# Patient Record
Sex: Male | Born: 1955 | Race: Black or African American | Hispanic: No | Marital: Single | Smoking: Never smoker
Health system: Southern US, Community
[De-identification: ages and names within clinical notes are randomized; demographics above are authoritative.]

## PROBLEM LIST (undated history)

## (undated) DIAGNOSIS — I10 Essential (primary) hypertension: Secondary | ICD-10-CM

## (undated) DIAGNOSIS — E785 Hyperlipidemia, unspecified: Secondary | ICD-10-CM

## (undated) DIAGNOSIS — E119 Type 2 diabetes mellitus without complications: Secondary | ICD-10-CM

## (undated) HISTORY — DX: Hyperlipidemia, unspecified: E78.5

## (undated) HISTORY — DX: Type 2 diabetes mellitus without complications: E11.9

## (undated) HISTORY — PX: OTHER SURGICAL HISTORY: SHX169

---

## 2013-06-13 DIAGNOSIS — Z0000 Encounter for general adult medical examination without abnormal findings: Secondary | ICD-10-CM | POA: Diagnosis not present

## 2013-06-13 DIAGNOSIS — E669 Obesity, unspecified: Secondary | ICD-10-CM | POA: Diagnosis not present

## 2013-07-05 DIAGNOSIS — E785 Hyperlipidemia, unspecified: Secondary | ICD-10-CM | POA: Diagnosis not present

## 2013-07-05 DIAGNOSIS — I10 Essential (primary) hypertension: Secondary | ICD-10-CM | POA: Diagnosis not present

## 2013-07-05 DIAGNOSIS — E669 Obesity, unspecified: Secondary | ICD-10-CM | POA: Diagnosis not present

## 2013-07-06 DIAGNOSIS — H6120 Impacted cerumen, unspecified ear: Secondary | ICD-10-CM | POA: Diagnosis not present

## 2013-07-06 DIAGNOSIS — Z125 Encounter for screening for malignant neoplasm of prostate: Secondary | ICD-10-CM | POA: Diagnosis not present

## 2013-07-06 DIAGNOSIS — E785 Hyperlipidemia, unspecified: Secondary | ICD-10-CM | POA: Diagnosis not present

## 2013-07-06 DIAGNOSIS — I10 Essential (primary) hypertension: Secondary | ICD-10-CM | POA: Diagnosis not present

## 2013-07-09 DIAGNOSIS — I10 Essential (primary) hypertension: Secondary | ICD-10-CM | POA: Diagnosis not present

## 2013-07-31 DIAGNOSIS — I10 Essential (primary) hypertension: Secondary | ICD-10-CM | POA: Diagnosis not present

## 2013-07-31 DIAGNOSIS — K219 Gastro-esophageal reflux disease without esophagitis: Secondary | ICD-10-CM | POA: Diagnosis not present

## 2013-08-21 DIAGNOSIS — M109 Gout, unspecified: Secondary | ICD-10-CM | POA: Diagnosis not present

## 2013-08-21 DIAGNOSIS — E119 Type 2 diabetes mellitus without complications: Secondary | ICD-10-CM | POA: Diagnosis not present

## 2013-08-21 DIAGNOSIS — I10 Essential (primary) hypertension: Secondary | ICD-10-CM | POA: Diagnosis not present

## 2013-08-21 DIAGNOSIS — E785 Hyperlipidemia, unspecified: Secondary | ICD-10-CM | POA: Diagnosis not present

## 2013-08-21 DIAGNOSIS — H539 Unspecified visual disturbance: Secondary | ICD-10-CM | POA: Diagnosis not present

## 2013-08-21 DIAGNOSIS — R5383 Other fatigue: Secondary | ICD-10-CM | POA: Diagnosis not present

## 2013-08-21 DIAGNOSIS — K219 Gastro-esophageal reflux disease without esophagitis: Secondary | ICD-10-CM | POA: Diagnosis not present

## 2013-08-21 DIAGNOSIS — I119 Hypertensive heart disease without heart failure: Secondary | ICD-10-CM | POA: Diagnosis not present

## 2013-08-21 DIAGNOSIS — R5381 Other malaise: Secondary | ICD-10-CM | POA: Diagnosis not present

## 2013-08-21 DIAGNOSIS — A048 Other specified bacterial intestinal infections: Secondary | ICD-10-CM | POA: Diagnosis not present

## 2013-08-22 DIAGNOSIS — Z125 Encounter for screening for malignant neoplasm of prostate: Secondary | ICD-10-CM | POA: Diagnosis not present

## 2013-08-22 DIAGNOSIS — R946 Abnormal results of thyroid function studies: Secondary | ICD-10-CM | POA: Diagnosis not present

## 2013-08-23 DIAGNOSIS — K219 Gastro-esophageal reflux disease without esophagitis: Secondary | ICD-10-CM | POA: Diagnosis not present

## 2013-08-30 ENCOUNTER — Encounter (HOSPITAL_COMMUNITY): Payer: Self-pay | Admitting: Emergency Medicine

## 2013-08-30 ENCOUNTER — Emergency Department (HOSPITAL_COMMUNITY): Payer: Medicare Other

## 2013-08-30 ENCOUNTER — Emergency Department (HOSPITAL_COMMUNITY)
Admission: EM | Admit: 2013-08-30 | Discharge: 2013-08-30 | Disposition: A | Discharge status: Home / Self Care | Payer: Medicare Other | Attending: Emergency Medicine | Admitting: Emergency Medicine

## 2013-08-30 DIAGNOSIS — D649 Anemia, unspecified: Secondary | ICD-10-CM | POA: Insufficient documentation

## 2013-08-30 DIAGNOSIS — M109 Gout, unspecified: Secondary | ICD-10-CM | POA: Insufficient documentation

## 2013-08-30 DIAGNOSIS — M25569 Pain in unspecified knee: Secondary | ICD-10-CM | POA: Insufficient documentation

## 2013-08-30 DIAGNOSIS — M10061 Idiopathic gout, right knee: Secondary | ICD-10-CM

## 2013-08-30 DIAGNOSIS — I10 Essential (primary) hypertension: Secondary | ICD-10-CM | POA: Insufficient documentation

## 2013-08-30 DIAGNOSIS — D72829 Elevated white blood cell count, unspecified: Secondary | ICD-10-CM | POA: Diagnosis not present

## 2013-08-30 DIAGNOSIS — Z792 Long term (current) use of antibiotics: Secondary | ICD-10-CM | POA: Insufficient documentation

## 2013-08-30 DIAGNOSIS — M25561 Pain in right knee: Secondary | ICD-10-CM

## 2013-08-30 HISTORY — DX: Essential (primary) hypertension: I10

## 2013-08-30 LAB — CBC WITH DIFFERENTIAL/PLATELET
BASOS ABS: 0 10*3/uL (ref 0.0–0.1)
BASOS PCT: 0 % (ref 0–1)
Eosinophils Absolute: 0 10*3/uL (ref 0.0–0.7)
Eosinophils Relative: 0 % (ref 0–5)
HCT: 35.6 % — ABNORMAL LOW (ref 39.0–52.0)
Hemoglobin: 11.6 g/dL — ABNORMAL LOW (ref 13.0–17.0)
LYMPHS PCT: 12 % (ref 12–46)
Lymphs Abs: 1.4 10*3/uL (ref 0.7–4.0)
MCH: 27.8 pg (ref 26.0–34.0)
MCHC: 32.6 g/dL (ref 30.0–36.0)
MCV: 85.4 fL (ref 78.0–100.0)
Monocytes Absolute: 1.1 10*3/uL — ABNORMAL HIGH (ref 0.1–1.0)
Monocytes Relative: 9 % (ref 3–12)
NEUTROS ABS: 9.5 10*3/uL — AB (ref 1.7–7.7)
NEUTROS PCT: 79 % — AB (ref 43–77)
Platelets: 289 10*3/uL (ref 150–400)
RBC: 4.17 MIL/uL — ABNORMAL LOW (ref 4.22–5.81)
RDW: 14.1 % (ref 11.5–15.5)
WBC: 12 10*3/uL — ABNORMAL HIGH (ref 4.0–10.5)

## 2013-08-30 LAB — SYNOVIAL CELL COUNT + DIFF, W/ CRYSTALS
Crystals, Fluid: NONE SEEN
Lymphocytes-Synovial Fld: 2 % (ref 0–20)
MONOCYTE-MACROPHAGE-SYNOVIAL FLUID: 9 % — AB (ref 50–90)
Neutrophil, Synovial: 89 % — ABNORMAL HIGH (ref 0–25)
WBC, SYNOVIAL: UNDETERMINED /mm3 (ref 0–200)

## 2013-08-30 LAB — BASIC METABOLIC PANEL
ANION GAP: 17 — AB (ref 5–15)
BUN: 29 mg/dL — ABNORMAL HIGH (ref 6–23)
CHLORIDE: 99 meq/L (ref 96–112)
CO2: 22 mEq/L (ref 19–32)
Calcium: 9.8 mg/dL (ref 8.4–10.5)
Creatinine, Ser: 1.45 mg/dL — ABNORMAL HIGH (ref 0.50–1.35)
GFR, EST AFRICAN AMERICAN: 61 mL/min — AB (ref >90)
GFR, EST NON AFRICAN AMERICAN: 52 mL/min — AB (ref >90)
Glucose, Bld: 103 mg/dL — ABNORMAL HIGH (ref 70–99)
POTASSIUM: 4.8 meq/L (ref 3.7–5.3)
SODIUM: 138 meq/L (ref 137–147)

## 2013-08-30 LAB — GRAM STAIN: SPECIAL REQUESTS: NORMAL

## 2013-08-30 LAB — C-REACTIVE PROTEIN: CRP: 2.2 mg/dL — AB (ref <0.60)

## 2013-08-30 LAB — URIC ACID: URIC ACID, SERUM: 9.1 mg/dL — AB (ref 4.0–7.8)

## 2013-08-30 LAB — SEDIMENTATION RATE: Sed Rate: 65 mm/hr — ABNORMAL HIGH (ref 0–16)

## 2013-08-30 MED ORDER — PREDNISONE 50 MG PO TABS: ORAL_TABLET | ORAL | Status: DC

## 2013-08-30 MED ORDER — OXYCODONE-ACETAMINOPHEN 5-325 MG PO TABS
1.0000 | ORAL_TABLET | Freq: Once | ORAL | Status: DC
  Filled 2013-08-30: qty 1

## 2013-08-30 MED ORDER — HYDROCODONE-ACETAMINOPHEN 5-325 MG PO TABS: 1.0000 | ORAL_TABLET | ORAL | Status: DC | PRN

## 2013-08-30 NOTE — ED Provider Notes (Signed)
Medical screening examination/treatment/procedure(s) were performed by non-physician practitioner and as supervising physician I was immediately available for consultation/collaboration.   EKG Interpretation None        Merryl Hacker, MD 08/30/13 [phone]

## 2013-08-30 NOTE — ED Provider Notes (Signed)
CSN: [id number]     Arrival date & time 08/30/13  1121 History  This chart was scribed for non-physician practitioner, Clayton Bibles, PA-C, working with Merryl Hacker, MD by Ladene Artist, ED Scribe. This patient was seen in room TR08C/TR08C and the patient's care was started at 11:57 AM.   Chief Complaint  Patient presents with  . Knee Pain   The history is provided by the patient. No language interpreter was used.  HPI Comments: Robert Cole is a 58 y.o. male, with a  H/o HTN, who presents to the Emergency Department complaining of constant R knee pain onset 2 days ago. Pt states that he "turned the wrong way" in bed prior to onset of pain. He states that he can not extend his leg due to severity of pain. Pt reports intermittent weakness in lower extremities with walking. Pt states that he walks a lot. He denies fall or injury. He also denies fever, urinary symptoms, numbness. No hx gout. Pt is currently on AMX and Clarithromycin for a stomach infection.   Later noted by sister who arrived much later that all of the brothers in her family have gout.    Past Medical History  Diagnosis Date  . Hypertension    History reviewed. No pertinent past surgical history. History reviewed. No pertinent family history. History  Substance Use Topics  . Smoking status: Never Smoker   . Smokeless tobacco: Not on file  . Alcohol Use: No    Review of Systems  Constitutional: Negative for fever and chills.  Respiratory: Negative for shortness of breath.   Cardiovascular: Negative for chest pain.  Gastrointestinal: Negative for nausea, vomiting, abdominal pain and diarrhea.  Genitourinary: Negative.   Musculoskeletal: Positive for arthralgias.  Neurological: Positive for weakness (intermittent). Negative for numbness.  All other systems reviewed and are negative.  Allergies  Review of patient's allergies indicates no known allergies.  Home Medications   Prior to Admission medications    Medication Sig Start Date End Date Taking? Authorizing Provider  amoxicillin (AMOXIL) 500 MG capsule Take 500 mg by mouth 2 (two) times daily.   Yes Historical Provider, MD  clarithromycin (BIAXIN) 500 MG tablet Take 500 mg by mouth 2 (two) times daily.   Yes Historical Provider, MD   Triage Vitals: BP 176/102  Pulse 115  Temp(Src) 98.8 F (37.1 C) (Oral)  Resp 18  SpO2 96% Physical Exam  Nursing note and vitals reviewed. Constitutional: He appears well-developed and well-nourished. No distress.  HENT:  Head: Normocephalic and atraumatic.  Neck: Neck supple.  Pulmonary/Chest: Effort normal.  Musculoskeletal:       Right knee: He exhibits decreased range of motion, effusion and erythema. Tenderness found.  R knee with anterior erythema, mild edema, joint effusion present Pt unable to extend his leg or move; fixed in 90 degree flexion Distal sensation & pulses intact No warmth  Neurological: He is alert.  Skin: He is not diaphoretic.   ED Course  ARTHOCENTESIS Date/Time: 08/30/2013 2:02 PM Performed by: Clayton Bibles Authorized by: Clayton Bibles Consent: Verbal consent obtained. Consent given by: patient Patient understanding: patient states understanding of the procedure being performed Imaging studies: imaging studies available Patient identity confirmed: verbally with patient Indications: pain,  possible septic joint,  diagnostic evaluation and joint swelling  Body area: knee Joint: right knee Local anesthesia used: yes Anesthesia: local infiltration Local anesthetic: lidocaine 2% without epinephrine Anesthetic total: 6 ml Patient sedated: no Needle gauge: 18 G Ultrasound guidance: no Approach: medial  Aspirate: blood-tinged,  serous and clear Aspirate amount: 1 ml Patient tolerance: Patient tolerated the procedure well with no immediate complications. Comments: Small amount of clear light yellow fluid aspirated, very small amount of blood-tinged fluid at end of  aspirate.    (including critical care time) DIAGNOSTIC STUDIES: Oxygen Saturation is 96% on RA, normal by my interpretation.    COORDINATION OF CARE: 12:01 PM-Discussed treatment plan which includes XR and diagnostic lab work with pt at bedside and pt agreed to plan.   3:40 PM-Spoke with pt about gout. His brother and sister are along bedside. All of pt's brothers have had gout.   3:44 PM-Spoke with pharmacist about possible Oxy and Clarithromycin interaction; she stated that it was nothing to worry about.   Labs Review Labs Reviewed  CBC WITH DIFFERENTIAL - Abnormal; Notable for the following:    WBC 12.0 (*)    RBC 4.17 (*)    Hemoglobin 11.6 (*)    HCT 35.6 (*)    Neutrophils Relative % 79 (*)    Neutro Abs 9.5 (*)    Monocytes Absolute 1.1 (*)    All other components within normal limits  URIC ACID - Abnormal; Notable for the following:    Uric Acid, Serum 9.1 (*)    All other components within normal limits  BASIC METABOLIC PANEL - Abnormal; Notable for the following:    Glucose, Bld 103 (*)    BUN 29 (*)    Creatinine, Ser 1.45 (*)    GFR calc non Af Amer 52 (*)    GFR calc Af Amer 61 (*)    Anion gap 17 (*)    All other components within normal limits  SEDIMENTATION RATE - Abnormal; Notable for the following:    Sed Rate 65 (*)    All other components within normal limits  SYNOVIAL CELL COUNT + DIFF, W/ CRYSTALS - Abnormal; Notable for the following:    Color, Synovial YELLOW (*)    Appearance-Synovial CLOUDY (*)    Neutrophil, Synovial 89 (*)    Monocyte-Macrophage-Synovial Fluid 9 (*)    All other components within normal limits  GRAM STAIN  BODY FLUID CULTURE  C-REACTIVE PROTEIN   Imaging Review Dg Knee Complete 4 Views Right  08/30/2013   CLINICAL DATA:  Injury to the right knee.  Right knee pain.  EXAM: RIGHT KNEE - COMPLETE 4+ VIEW  COMPARISON:  No priors.  FINDINGS: Multiple views of the right knee demonstrate no acute displaced fracture, subluxation,  dislocation, or soft tissue abnormality.  IMPRESSION: No acute radiographic abnormality of the right knee.   Electronically Signed   By: Vinnie Langton M.D.   On: 08/30/2013 13:01    EKG Interpretation None      Not enough fluid for all tests ordered - prioritized culture and gram stain.  Lab unable to do cell counts and crystals.  Also note that they would be unable to do crystal count because the sample was clotted.   Reviewed lab results with Dr Dina Rich.  Pt to be treated for gout.    Filed Vitals:   08/30/13 1550  BP: 183/94  Pulse: 85  Temp:   Resp:      MDM   Final diagnoses:  Right knee pain  Acute idiopathic gout of right knee    Afebrile nontoxic pt with right knee pain, swelling, erythema without injury. CBC shows leukocytosis and mild anemia.  BMP shows renal insufficiency with no labs available for comparison - per  sister, Dr Vista Lawman (PCP) had mentioned "something about his kidneys" at their last visit and they have a follow up scheduled for mid August.  Uric acid is elevated.  Sed rate also moderately elevated.  I was only able to aspirate a small amount of synovial fluid but its appearance was reassuring.  Culture pending.  Gram stain showed no organisms.  Given labs and fluid results, will treat as gout with close orthopedic follow up.  Discussed return precautions.  Pt given prednisone and norco.  I did not give patient high dose NSAIDs given renal function.  Pt declined pain medication throughout visit.  D/C with crutches.  Discussed result, findings, treatment, and follow up  with patient as well as patient's brother and sister.  Pt given return precautions.  Pt verbalizes understanding and agrees with plan.      I doubt any other EMC precluding discharge at this time including, but not necessarily limited to the following: septic joint  I personally performed the services described in this documentation, which was scribed in my presence. The recorded information  has been reviewed and is accurate.    Har[place], PA-C 08/30/13 [phone]

## 2013-08-30 NOTE — Discharge Instructions (Signed)
Read the information below.  Use the prescribed medication as directed.  Please discuss all new medications with your pharmacist.  Do not take additional tylenol while taking the prescribed pain medication to avoid overdose.  You may return to the Emergency Department at any time for worsening condition or any new symptoms that concern you.  If you develop uncontrolled pain, weakness or numbness of the extremity, severe discoloration of the skin, or you are unable to walk, return to the ER for a recheck.       Gout Gout is when your joints become red, sore, and swell (inflamed). This is caused by the buildup of uric acid crystals in the joints. Uric acid is a chemical that is normally in the blood. If the level of uric acid gets too high in the blood, these crystals form in your joints and tissues. Over time, these crystals can form into masses near the joints and tissues. These masses can destroy bone and cause the bone to look misshapen (deformed). HOME CARE   Do not take aspirin for pain.  Only take medicine as told by your doctor.  Rest the joint as much as you can. When in bed, keep sheets and blankets off painful areas.  Keep the sore joints raised (elevated).  Put warm or cold packs on painful joints. Use of warm or cold packs depends on which works best for you.  Use crutches if the painful joint is in your leg.  Drink enough fluids to keep your pee (urine) clear or pale yellow. Limit alcohol, sugary drinks, and drinks with fructose in them.  Follow your diet instructions. Pay careful attention to how much protein you eat. Include fruits, vegetables, whole grains, and fat-free or low-fat milk products in your daily diet. Talk to your doctor or dietitian about the use of coffee, vitamin C, and cherries. These may help lower uric acid levels.  Keep a healthy body weight. GET HELP RIGHT AWAY IF:   You have watery poop (diarrhea), throw up (vomit), or have any side effects from  medicines.  You do not feel better in 24 hours, or you are getting worse.  Your joint becomes suddenly more tender, and you have chills or a fever. MAKE SURE YOU:   Understand these instructions.  Will watch your condition.  Will get help right away if you are not doing well or get worse. Document Released: 10/28/2007 Document Revised: 06/04/2013 Document Reviewed: 09/01/2011 [facility] Patient Information 2015 [place], [place]. This information is not intended to replace advice given to you by your health care provider. Make sure you discuss any questions you have with your health care provider.  Knee Pain Knee pain can be a result of an injury or other medical conditions. Treatment will depend on the cause of your pain. HOME CARE  Only take medicine as told by your doctor.  Keep a healthy weight. Being overweight can make the knee hurt more.  Stretch before exercising or playing sports.  If there is constant knee pain, change the way you exercise. Ask your doctor for advice.  Make sure shoes fit well. Choose the right shoe for the sport or activity.  Protect your knees. Wear kneepads if needed.  Rest when you are tired. GET HELP RIGHT AWAY IF:   Your knee pain does not stop.  Your knee pain does not get better.  Your knee joint feels hot to the touch.  You have a fever. MAKE SURE YOU:   Understand these instructions.  Will watch this condition.  Will get help right away if you are not doing well or get worse. Document Released: 04/16/2008 Document Revised: 04/12/2011 Document Reviewed: 04/16/2008 [facility] Patient Information 2015 [place], [place]. This information is not intended to replace advice given to you by your health care provider. Make sure you discuss any questions you have with your health care provider.  Low-Purine Diet Purines are compounds that affect the level of uric acid in your body. A low-purine diet is a diet that is low in purines. Eating a  low-purine diet can prevent the level of uric acid in your body from getting too high and causing gout or kidney stones or both. WHAT DO I NEED TO KNOW ABOUT THIS DIET?  Choose low-purine foods. Examples of low-purine foods are listed in the next section.  Drink plenty of fluids, especially water. Fluids can help remove uric acid from your body. Try to drink 8-16 cups (1.9-3.8 L) a day.  Limit foods high in fat, especially saturated fat, as fat makes it harder for the body to get rid of uric acid. Foods high in saturated fat include pizza, cheese, ice cream, whole milk, fried foods, and gravies. Choose foods that are lower in fat and lean sources of protein. Use olive oil when cooking as it contains healthy fats that are not high in saturated fat.  Limit alcohol. Alcohol interferes with the elimination of uric acid from your body. If you are having a gout attack, avoid all alcohol.  Keep in mind that different people's bodies react differently to different foods. You will probably learn over time which foods do or do not affect you. If you discover that a food tends to cause your gout to flare up, avoid eating that food. You can more freely enjoy foods that do not cause problems. If you have any questions about a food item, talk to your dietitian or health care provider. WHICH FOODS ARE LOW, MODERATE, AND HIGH IN PURINES? The following is a list of foods that are low, moderate, and high in purines. You can eat any amount of the foods that are low in purines. You may be able to have small amounts of foods that are moderate in purines. Ask your health care provider how much of a food moderate in purines you can have. Avoid foods high in purines. Grains  Foods low in purines: Enriched white bread, pasta, rice, cake, cornbread, popcorn.  Foods moderate in purines: Whole-grain breads and cereals, wheat germ, bran, oatmeal. Uncooked oatmeal. Dry wheat bran or wheat germ.  Foods high in purines:  Pancakes, [place] toast, biscuits, muffins. Vegetables  Foods low in purines: All vegetables, except those that are moderate in purines.  Foods moderate in purines: Asparagus, cauliflower, spinach, mushrooms, green peas. Fruits  All fruits are low in purines. Meats and other Protein Foods  Foods low in purines: Eggs, nuts, peanut butter.  Foods moderate in purines: 80-90% lean beef, lamb, veal, pork, poultry, fish, eggs, peanut butter, nuts. Crab, lobster, oysters, and shrimp. Cooked dried beans, peas, and lentils.  Foods high in purines: Anchovies, sardines, herring, mussels, tuna, codfish, scallops, trout, and haddock. Berniece Salines. Organ meats (such as liver or kidney). Tripe. Game meat. Goose. Sweetbreads. Dairy  All dairy foods are low in purines. Low-fat and fat-free dairy products are best because they are low in saturated fat. Beverages  Drinks low in purines: Water, carbonated beverages, tea, coffee, cocoa.  Drinks moderate in purines: Soft drinks and other drinks sweetened with  high-fructose corn syrup. Juices. To find whether a food or drink is sweetened with high-fructose corn syrup, look at the ingredients list.  Drinks high in purines: Alcoholic beverages (such as beer). Condiments  Foods low in purines: Salt, herbs, olives, pickles, relishes, vinegar.  Foods moderate in purines: Butter, margarine, oils, mayonnaise. Fats and Oils  Foods low in purines: All types, except gravies and sauces made with meat.  Foods high in purines: Gravies and sauces made with meat. Other Foods  Foods low in purines: Sugars, sweets, gelatin. Cake. Soups made without meat.  Foods moderate in purines: Meat-based or fish-based soups, broths, or bouillons. Foods and drinks sweetened with high-fructose corn syrup.  Foods high in purines: High-fat desserts (such as ice cream, cookies, cakes, pies, doughnuts, and chocolate). Contact your dietitian for more information on foods that are not  listed here. Document Released: 05/15/2010 Document Revised: 01/23/2013 Document Reviewed: 12/25/2012 [facility] Patient Information 2015 [place], [place]. This information is not intended to replace advice given to you by your health care provider. Make sure you discuss any questions you have with your health care provider.

## 2013-08-30 NOTE — Progress Notes (Signed)
[facility] Community Liaison at [place]:  Will be sending patient, upon discharge, information on [facility] [employer] and other resources to help patient establish primary care.

## 2013-08-30 NOTE — ED Notes (Signed)
Pt c/o right knee pain after turning "wrong" in bed x 2 days ago

## 2013-09-02 LAB — BODY FLUID CULTURE
CULTURE: NO GROWTH
SPECIAL REQUESTS: NORMAL

## 2013-09-27 DIAGNOSIS — B353 Tinea pedis: Secondary | ICD-10-CM | POA: Diagnosis not present

## 2013-09-27 DIAGNOSIS — M2040 Other hammer toe(s) (acquired), unspecified foot: Secondary | ICD-10-CM | POA: Diagnosis not present

## 2013-09-27 DIAGNOSIS — E119 Type 2 diabetes mellitus without complications: Secondary | ICD-10-CM | POA: Diagnosis not present

## 2013-09-27 DIAGNOSIS — L608 Other nail disorders: Secondary | ICD-10-CM | POA: Diagnosis not present

## 2013-10-01 DIAGNOSIS — E119 Type 2 diabetes mellitus without complications: Secondary | ICD-10-CM | POA: Diagnosis not present

## 2013-10-01 DIAGNOSIS — I119 Hypertensive heart disease without heart failure: Secondary | ICD-10-CM | POA: Diagnosis not present

## 2013-10-01 DIAGNOSIS — I10 Essential (primary) hypertension: Secondary | ICD-10-CM | POA: Diagnosis not present

## 2013-10-01 DIAGNOSIS — N19 Unspecified kidney failure: Secondary | ICD-10-CM | POA: Diagnosis not present

## 2013-10-01 DIAGNOSIS — R946 Abnormal results of thyroid function studies: Secondary | ICD-10-CM | POA: Diagnosis not present

## 2013-10-01 DIAGNOSIS — A048 Other specified bacterial intestinal infections: Secondary | ICD-10-CM | POA: Diagnosis not present

## 2013-10-01 DIAGNOSIS — K219 Gastro-esophageal reflux disease without esophagitis: Secondary | ICD-10-CM | POA: Diagnosis not present

## 2013-10-01 DIAGNOSIS — E785 Hyperlipidemia, unspecified: Secondary | ICD-10-CM | POA: Diagnosis not present

## 2013-10-16 ENCOUNTER — Encounter: Payer: Medicare Other | Attending: Internal Medicine

## 2013-10-16 VITALS — Ht 65.0 in | Wt 154.0 lb

## 2013-10-16 DIAGNOSIS — E119 Type 2 diabetes mellitus without complications: Secondary | ICD-10-CM | POA: Diagnosis not present

## 2013-10-16 DIAGNOSIS — Z713 Dietary counseling and surveillance: Secondary | ICD-10-CM | POA: Insufficient documentation

## 2013-10-18 NOTE — Progress Notes (Signed)
Patient was seen on 10/16/13 for the first of a series of three diabetes self-management courses at [facility].  Patient Education Plan per assessed needs and concerns is to attend four course education program for Diabetes Self Management Education.  Current HbA1c: Not available   FBS 107  The following learning objectives were met by the patient during this class:  Describe diabetes  State some common risk factors for diabetes  Defines the role of glucose and insulin  Identifies type of diabetes and pathophysiology  Describe the relationship between diabetes and cardiovascular risk  State the members of the Healthcare Team  States the rationale for glucose monitoring  State when to test glucose  State their individual Target Range  State the importance of logging glucose readings  Describe how to interpret glucose readings  Identifies A1C target  Explain the correlation between A1c and eAG values  State symptoms and treatment of high blood glucose  State symptoms and treatment of low blood glucose  Explain proper technique for glucose testing  Identifies proper sharps disposal  Handouts given during class include:  Living Well with Diabetes book  Carb Counting and Meal Planning book  Meal Plan Card  Carbohydrate guide  Meal planning worksheet  Low Sodium Flavoring Tips  The diabetes portion plate  I3J to eAG Conversion Chart  Diabetes Medications  Diabetes Recommended Care Schedule  Support Group  Diabetes Success Plan  Core Class Satisfaction Survey  Follow-Up Plan:  Attend core 2

## 2013-10-23 DIAGNOSIS — E119 Type 2 diabetes mellitus without complications: Secondary | ICD-10-CM

## 2013-10-23 NOTE — Progress Notes (Signed)

## 2013-10-30 DIAGNOSIS — I10 Essential (primary) hypertension: Secondary | ICD-10-CM | POA: Diagnosis not present

## 2013-10-30 DIAGNOSIS — N19 Unspecified kidney failure: Secondary | ICD-10-CM | POA: Diagnosis not present

## 2013-10-30 DIAGNOSIS — I119 Hypertensive heart disease without heart failure: Secondary | ICD-10-CM | POA: Diagnosis not present

## 2013-10-30 DIAGNOSIS — Z23 Encounter for immunization: Secondary | ICD-10-CM | POA: Diagnosis not present

## 2013-10-30 DIAGNOSIS — K219 Gastro-esophageal reflux disease without esophagitis: Secondary | ICD-10-CM | POA: Diagnosis not present

## 2013-10-30 DIAGNOSIS — E119 Type 2 diabetes mellitus without complications: Secondary | ICD-10-CM | POA: Diagnosis not present

## 2013-10-30 DIAGNOSIS — R946 Abnormal results of thyroid function studies: Secondary | ICD-10-CM | POA: Diagnosis not present

## 2013-10-30 DIAGNOSIS — E785 Hyperlipidemia, unspecified: Secondary | ICD-10-CM | POA: Diagnosis not present

## 2013-10-30 NOTE — Progress Notes (Signed)
Patient was seen on 10/30/2013 for the third of a series of three diabetes self-management courses at the [facility]. The following learning objectives were met by the patient during this class:    State the amount of activity recommended for healthy living   Describe activities suitable for individual needs   Identify ways to regularly incorporate activity into daily life   Identify barriers to activity and ways to over come these barriers  Identify diabetes medications being personally used and their primary action for lowering glucose and possible side effects   Describe role of stress on blood glucose and develop strategies to address psychosocial issues   Identify diabetes complications and ways to prevent them  Explain how to manage diabetes during illness   Evaluate success in meeting personal goal   Establish 2-3 goals that they will plan to diligently work on until they return for the  36-monthfollow-up visit  Goals:   I will count my carb choices at most meals and snacks  I will be active   3 times a week  Your patient has identified these potential barriers to change:  None stated  Your patient has identified their diabetes self-care support plan as  Family Support  Plan:  Attend Core 4 in 4 months

## 2013-12-31 DIAGNOSIS — L602 Onychogryphosis: Secondary | ICD-10-CM | POA: Diagnosis not present

## 2013-12-31 DIAGNOSIS — E119 Type 2 diabetes mellitus without complications: Secondary | ICD-10-CM | POA: Diagnosis not present

## 2014-02-04 ENCOUNTER — Ambulatory Visit: Payer: Medicare Other

## 2014-02-06 DIAGNOSIS — N19 Unspecified kidney failure: Secondary | ICD-10-CM | POA: Diagnosis not present

## 2014-02-06 DIAGNOSIS — I119 Hypertensive heart disease without heart failure: Secondary | ICD-10-CM | POA: Diagnosis not present

## 2014-02-06 DIAGNOSIS — E119 Type 2 diabetes mellitus without complications: Secondary | ICD-10-CM | POA: Diagnosis not present

## 2014-02-06 DIAGNOSIS — E784 Other hyperlipidemia: Secondary | ICD-10-CM | POA: Diagnosis not present

## 2014-02-06 DIAGNOSIS — K219 Gastro-esophageal reflux disease without esophagitis: Secondary | ICD-10-CM | POA: Diagnosis not present

## 2014-02-06 DIAGNOSIS — I10 Essential (primary) hypertension: Secondary | ICD-10-CM | POA: Diagnosis not present

## 2014-03-05 DIAGNOSIS — E119 Type 2 diabetes mellitus without complications: Secondary | ICD-10-CM | POA: Diagnosis not present

## 2014-03-05 DIAGNOSIS — R21 Rash and other nonspecific skin eruption: Secondary | ICD-10-CM | POA: Diagnosis not present

## 2014-03-08 DIAGNOSIS — I517 Cardiomegaly: Secondary | ICD-10-CM | POA: Diagnosis not present

## 2014-03-08 DIAGNOSIS — K219 Gastro-esophageal reflux disease without esophagitis: Secondary | ICD-10-CM | POA: Diagnosis not present

## 2014-03-08 DIAGNOSIS — E785 Hyperlipidemia, unspecified: Secondary | ICD-10-CM | POA: Diagnosis not present

## 2014-03-08 DIAGNOSIS — E119 Type 2 diabetes mellitus without complications: Secondary | ICD-10-CM | POA: Diagnosis not present

## 2014-03-08 DIAGNOSIS — I10 Essential (primary) hypertension: Secondary | ICD-10-CM | POA: Diagnosis not present

## 2014-04-09 DIAGNOSIS — K219 Gastro-esophageal reflux disease without esophagitis: Secondary | ICD-10-CM | POA: Diagnosis not present

## 2014-04-09 DIAGNOSIS — E784 Other hyperlipidemia: Secondary | ICD-10-CM | POA: Diagnosis not present

## 2014-04-09 DIAGNOSIS — E119 Type 2 diabetes mellitus without complications: Secondary | ICD-10-CM | POA: Diagnosis not present

## 2014-04-09 DIAGNOSIS — I119 Hypertensive heart disease without heart failure: Secondary | ICD-10-CM | POA: Diagnosis not present

## 2014-04-09 DIAGNOSIS — I10 Essential (primary) hypertension: Secondary | ICD-10-CM | POA: Diagnosis not present

## 2014-05-06 ENCOUNTER — Ambulatory Visit: Payer: Medicare Other

## 2014-09-25 DIAGNOSIS — K219 Gastro-esophageal reflux disease without esophagitis: Secondary | ICD-10-CM | POA: Diagnosis not present

## 2014-09-25 DIAGNOSIS — E784 Other hyperlipidemia: Secondary | ICD-10-CM | POA: Diagnosis not present

## 2014-09-25 DIAGNOSIS — I119 Hypertensive heart disease without heart failure: Secondary | ICD-10-CM | POA: Diagnosis not present

## 2014-09-25 DIAGNOSIS — I10 Essential (primary) hypertension: Secondary | ICD-10-CM | POA: Diagnosis not present

## 2014-09-25 DIAGNOSIS — E119 Type 2 diabetes mellitus without complications: Secondary | ICD-10-CM | POA: Diagnosis not present

## 2014-10-02 DIAGNOSIS — L602 Onychogryphosis: Secondary | ICD-10-CM | POA: Diagnosis not present

## 2014-10-02 DIAGNOSIS — E119 Type 2 diabetes mellitus without complications: Secondary | ICD-10-CM | POA: Diagnosis not present

## 2014-12-23 DIAGNOSIS — Z0000 Encounter for general adult medical examination without abnormal findings: Secondary | ICD-10-CM | POA: Diagnosis not present

## 2014-12-23 DIAGNOSIS — Z23 Encounter for immunization: Secondary | ICD-10-CM | POA: Diagnosis not present

## 2014-12-23 DIAGNOSIS — R972 Elevated prostate specific antigen [PSA]: Secondary | ICD-10-CM | POA: Diagnosis not present

## 2014-12-23 DIAGNOSIS — R946 Abnormal results of thyroid function studies: Secondary | ICD-10-CM | POA: Diagnosis not present

## 2014-12-23 DIAGNOSIS — E291 Testicular hypofunction: Secondary | ICD-10-CM | POA: Diagnosis not present

## 2014-12-23 DIAGNOSIS — Z0100 Encounter for examination of eyes and vision without abnormal findings: Secondary | ICD-10-CM | POA: Diagnosis not present

## 2014-12-23 DIAGNOSIS — Z0110 Encounter for examination of ears and hearing without abnormal findings: Secondary | ICD-10-CM | POA: Diagnosis not present

## 2014-12-23 DIAGNOSIS — E119 Type 2 diabetes mellitus without complications: Secondary | ICD-10-CM | POA: Diagnosis not present

## 2014-12-23 DIAGNOSIS — I10 Essential (primary) hypertension: Secondary | ICD-10-CM | POA: Diagnosis not present

## 2014-12-23 DIAGNOSIS — M109 Gout, unspecified: Secondary | ICD-10-CM | POA: Diagnosis not present

## 2014-12-23 DIAGNOSIS — K219 Gastro-esophageal reflux disease without esophagitis: Secondary | ICD-10-CM | POA: Diagnosis not present

## 2014-12-23 DIAGNOSIS — E784 Other hyperlipidemia: Secondary | ICD-10-CM | POA: Diagnosis not present

## 2014-12-23 DIAGNOSIS — R5383 Other fatigue: Secondary | ICD-10-CM | POA: Diagnosis not present

## 2015-03-04 DIAGNOSIS — K219 Gastro-esophageal reflux disease without esophagitis: Secondary | ICD-10-CM | POA: Diagnosis not present

## 2015-03-04 DIAGNOSIS — E784 Other hyperlipidemia: Secondary | ICD-10-CM | POA: Diagnosis not present

## 2015-03-04 DIAGNOSIS — M109 Gout, unspecified: Secondary | ICD-10-CM | POA: Diagnosis not present

## 2015-03-04 DIAGNOSIS — R739 Hyperglycemia, unspecified: Secondary | ICD-10-CM | POA: Diagnosis not present

## 2015-03-04 DIAGNOSIS — I10 Essential (primary) hypertension: Secondary | ICD-10-CM | POA: Diagnosis not present

## 2015-03-04 DIAGNOSIS — E119 Type 2 diabetes mellitus without complications: Secondary | ICD-10-CM | POA: Diagnosis not present

## 2015-05-20 DIAGNOSIS — E784 Other hyperlipidemia: Secondary | ICD-10-CM | POA: Diagnosis not present

## 2015-05-20 DIAGNOSIS — K219 Gastro-esophageal reflux disease without esophagitis: Secondary | ICD-10-CM | POA: Diagnosis not present

## 2015-05-20 DIAGNOSIS — I10 Essential (primary) hypertension: Secondary | ICD-10-CM | POA: Diagnosis not present

## 2015-05-20 DIAGNOSIS — E119 Type 2 diabetes mellitus without complications: Secondary | ICD-10-CM | POA: Diagnosis not present

## 2015-05-20 DIAGNOSIS — M109 Gout, unspecified: Secondary | ICD-10-CM | POA: Diagnosis not present

## 2015-07-24 DIAGNOSIS — E119 Type 2 diabetes mellitus without complications: Secondary | ICD-10-CM | POA: Diagnosis not present

## 2015-07-24 DIAGNOSIS — M109 Gout, unspecified: Secondary | ICD-10-CM | POA: Diagnosis not present

## 2015-07-24 DIAGNOSIS — K219 Gastro-esophageal reflux disease without esophagitis: Secondary | ICD-10-CM | POA: Diagnosis not present

## 2015-07-24 DIAGNOSIS — E784 Other hyperlipidemia: Secondary | ICD-10-CM | POA: Diagnosis not present

## 2015-07-24 DIAGNOSIS — I10 Essential (primary) hypertension: Secondary | ICD-10-CM | POA: Diagnosis not present

## 2015-07-24 DIAGNOSIS — R739 Hyperglycemia, unspecified: Secondary | ICD-10-CM | POA: Diagnosis not present

## 2015-07-30 DIAGNOSIS — M79671 Pain in right foot: Secondary | ICD-10-CM | POA: Diagnosis not present

## 2015-07-30 DIAGNOSIS — B351 Tinea unguium: Secondary | ICD-10-CM | POA: Diagnosis not present

## 2015-07-30 DIAGNOSIS — I739 Peripheral vascular disease, unspecified: Secondary | ICD-10-CM | POA: Diagnosis not present

## 2015-07-30 DIAGNOSIS — L603 Nail dystrophy: Secondary | ICD-10-CM | POA: Diagnosis not present

## 2015-07-30 DIAGNOSIS — E1151 Type 2 diabetes mellitus with diabetic peripheral angiopathy without gangrene: Secondary | ICD-10-CM | POA: Diagnosis not present

## 2015-07-30 DIAGNOSIS — M79672 Pain in left foot: Secondary | ICD-10-CM | POA: Diagnosis not present

## 2015-08-26 DIAGNOSIS — B351 Tinea unguium: Secondary | ICD-10-CM | POA: Diagnosis not present

## 2015-08-27 DIAGNOSIS — M79671 Pain in right foot: Secondary | ICD-10-CM | POA: Diagnosis not present

## 2015-08-27 DIAGNOSIS — B351 Tinea unguium: Secondary | ICD-10-CM | POA: Diagnosis not present

## 2015-08-27 DIAGNOSIS — M79672 Pain in left foot: Secondary | ICD-10-CM | POA: Diagnosis not present

## 2015-09-24 DIAGNOSIS — M79672 Pain in left foot: Secondary | ICD-10-CM | POA: Diagnosis not present

## 2015-09-24 DIAGNOSIS — B351 Tinea unguium: Secondary | ICD-10-CM | POA: Diagnosis not present

## 2015-09-24 DIAGNOSIS — M79671 Pain in right foot: Secondary | ICD-10-CM | POA: Diagnosis not present

## 2015-11-20 DIAGNOSIS — K219 Gastro-esophageal reflux disease without esophagitis: Secondary | ICD-10-CM | POA: Diagnosis not present

## 2015-11-20 DIAGNOSIS — I10 Essential (primary) hypertension: Secondary | ICD-10-CM | POA: Diagnosis not present

## 2015-11-20 DIAGNOSIS — E784 Other hyperlipidemia: Secondary | ICD-10-CM | POA: Diagnosis not present

## 2015-11-20 DIAGNOSIS — E119 Type 2 diabetes mellitus without complications: Secondary | ICD-10-CM | POA: Diagnosis not present

## 2015-11-20 DIAGNOSIS — M109 Gout, unspecified: Secondary | ICD-10-CM | POA: Diagnosis not present

## 2015-11-20 DIAGNOSIS — Z125 Encounter for screening for malignant neoplasm of prostate: Secondary | ICD-10-CM | POA: Diagnosis not present

## 2015-12-24 DIAGNOSIS — E119 Type 2 diabetes mellitus without complications: Secondary | ICD-10-CM | POA: Diagnosis not present

## 2015-12-24 DIAGNOSIS — I10 Essential (primary) hypertension: Secondary | ICD-10-CM | POA: Diagnosis not present

## 2015-12-24 DIAGNOSIS — K219 Gastro-esophageal reflux disease without esophagitis: Secondary | ICD-10-CM | POA: Diagnosis not present

## 2015-12-24 DIAGNOSIS — M109 Gout, unspecified: Secondary | ICD-10-CM | POA: Diagnosis not present

## 2015-12-24 DIAGNOSIS — E784 Other hyperlipidemia: Secondary | ICD-10-CM | POA: Diagnosis not present

## 2015-12-24 DIAGNOSIS — Z0000 Encounter for general adult medical examination without abnormal findings: Secondary | ICD-10-CM | POA: Diagnosis not present

## 2016-03-17 DIAGNOSIS — E119 Type 2 diabetes mellitus without complications: Secondary | ICD-10-CM | POA: Diagnosis not present

## 2016-03-17 DIAGNOSIS — K219 Gastro-esophageal reflux disease without esophagitis: Secondary | ICD-10-CM | POA: Diagnosis not present

## 2016-03-17 DIAGNOSIS — M109 Gout, unspecified: Secondary | ICD-10-CM | POA: Diagnosis not present

## 2016-03-17 DIAGNOSIS — E784 Other hyperlipidemia: Secondary | ICD-10-CM | POA: Diagnosis not present

## 2016-03-17 DIAGNOSIS — I10 Essential (primary) hypertension: Secondary | ICD-10-CM | POA: Diagnosis not present

## 2016-04-11 IMAGING — CR DG KNEE COMPLETE 4+V*R*
4 series · 4 of 4 positions shown · non-contrast
Comparison: No priors.

CLINICAL DATA: Injury to the right knee.  Right knee pain.

EXAM:
RIGHT KNEE - COMPLETE 4+ VIEW

[t knee ap right]
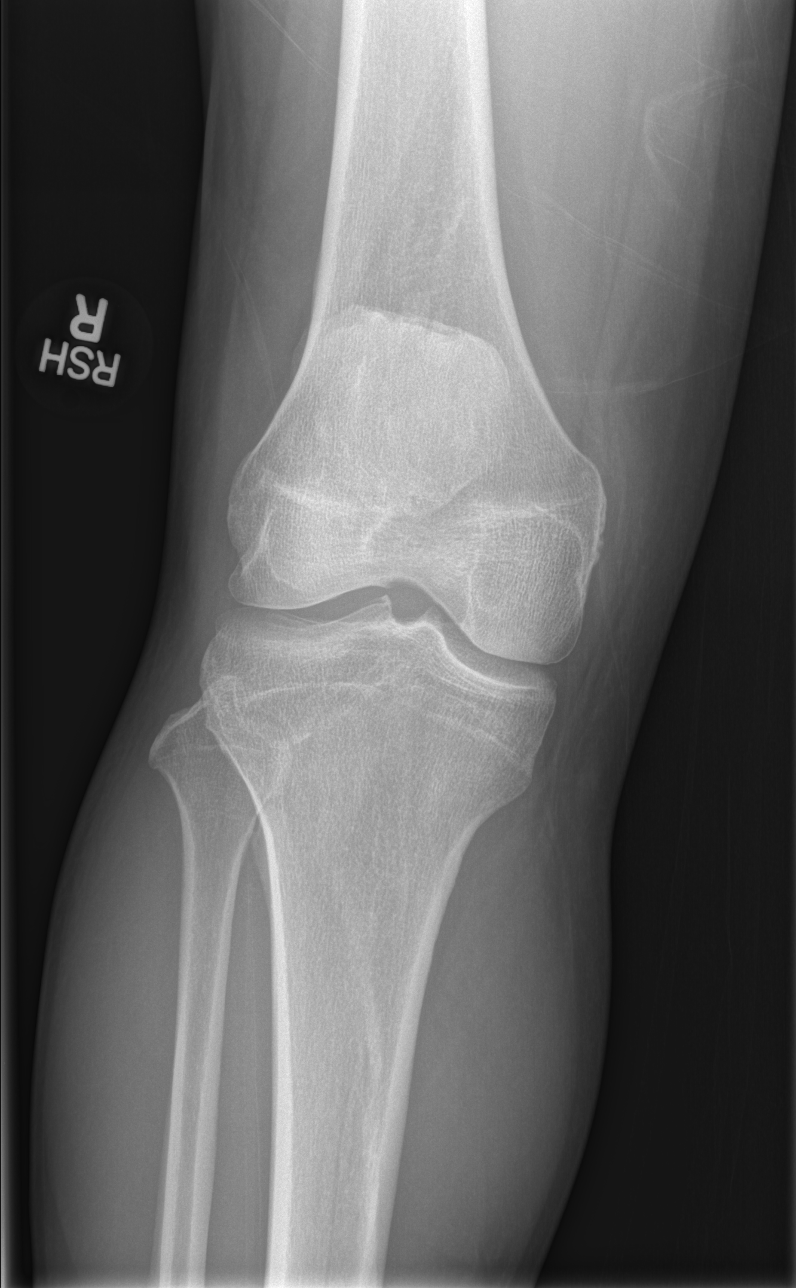

[t knee obl right (1 of 2)]
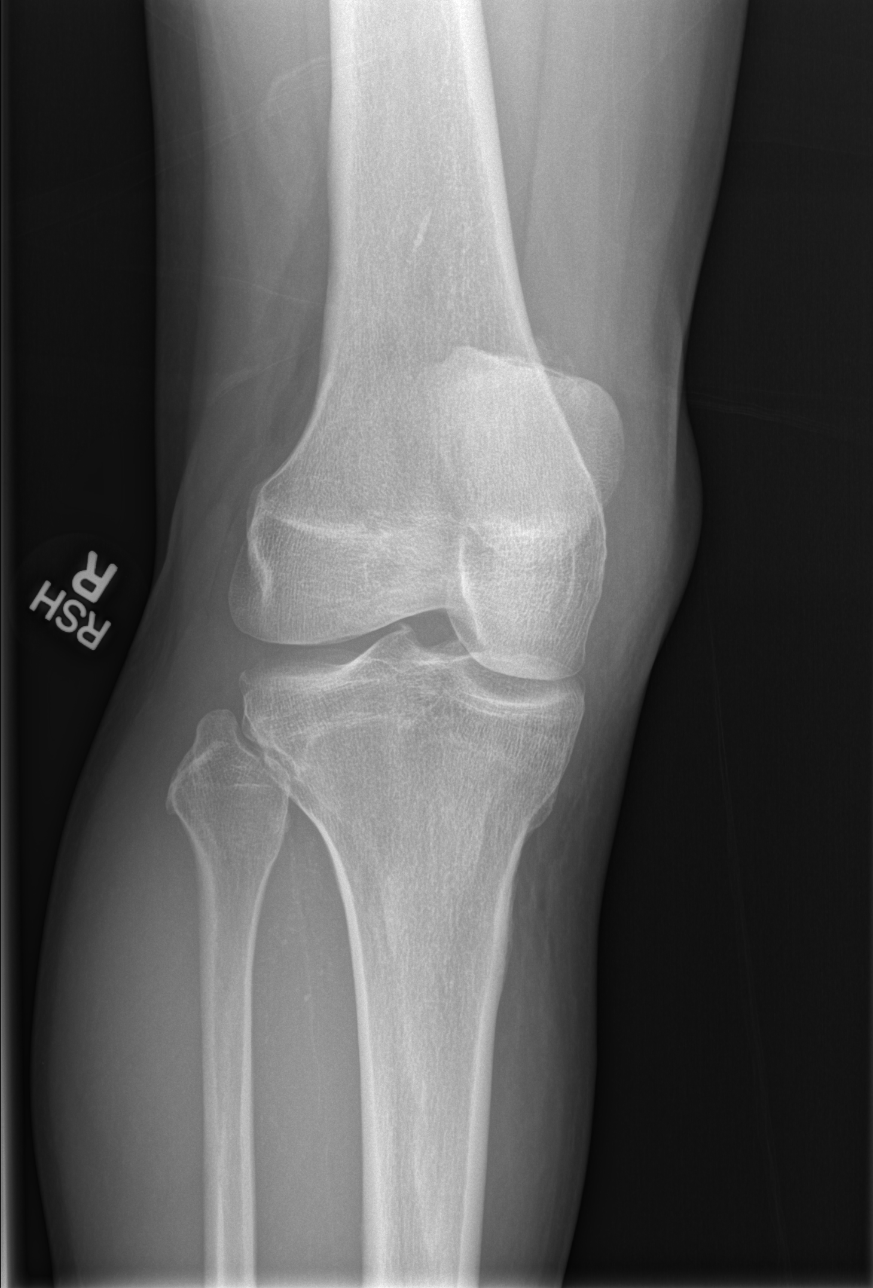

[t knee obl right (2 of 2)]
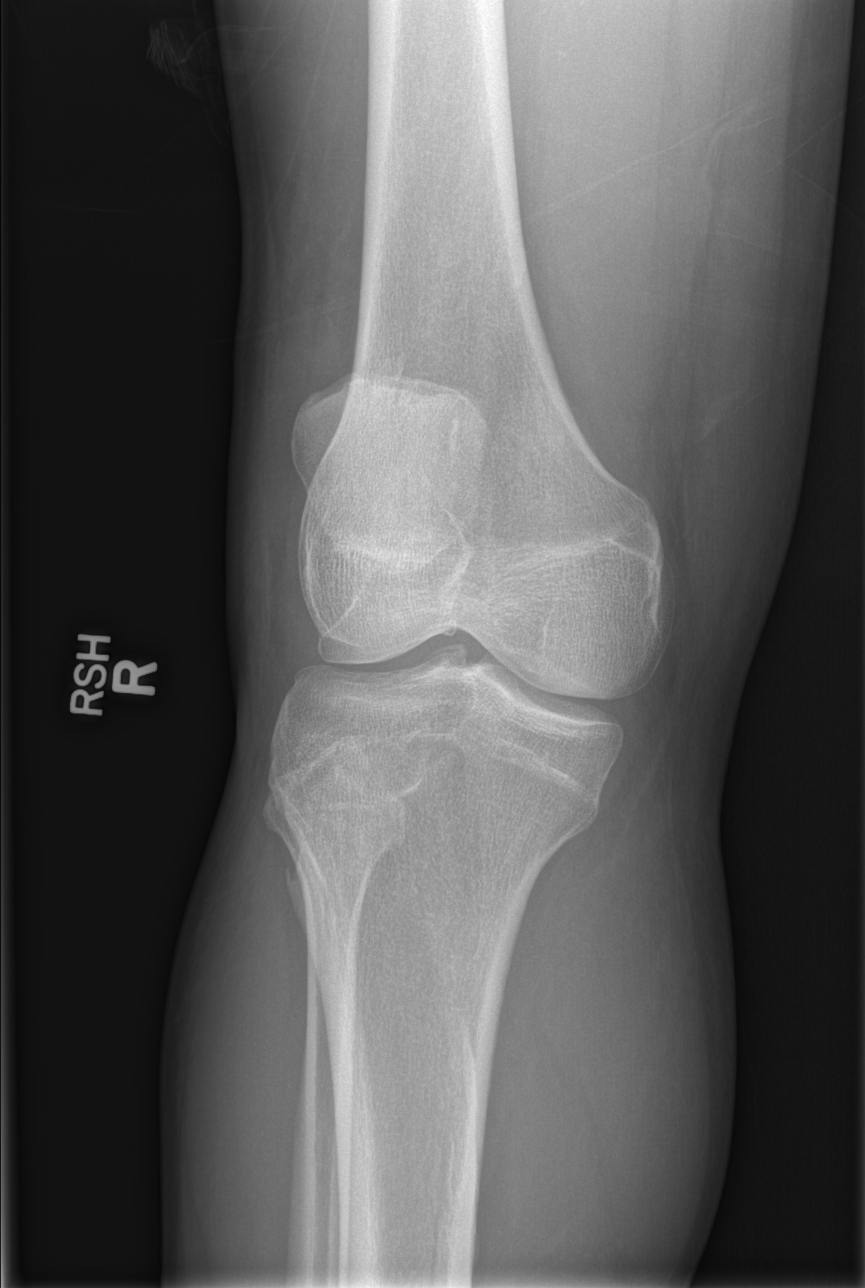

[t knee lat right]
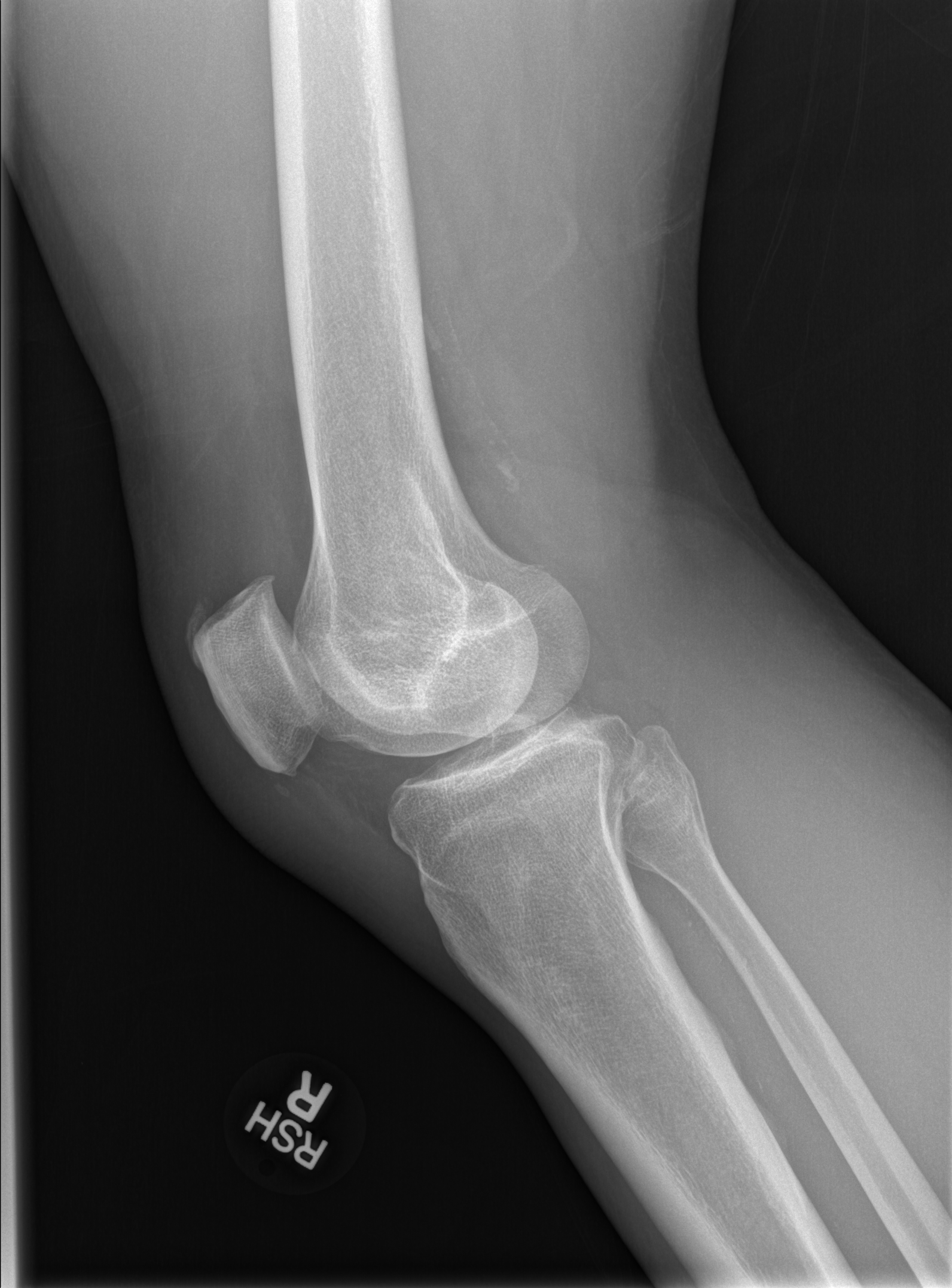

[4 of 4 positions shown; findings below may reference images not displayed]

FINDINGS: Multiple views of the right knee demonstrate no acute displaced
fracture, subluxation, dislocation, or soft tissue abnormality.
IMPRESSION: No acute radiographic abnormality of the right knee.

## 2017-01-03 DIAGNOSIS — Z0000 Encounter for general adult medical examination without abnormal findings: Secondary | ICD-10-CM | POA: Diagnosis not present

## 2017-01-03 DIAGNOSIS — K219 Gastro-esophageal reflux disease without esophagitis: Secondary | ICD-10-CM | POA: Diagnosis not present

## 2017-01-03 DIAGNOSIS — I10 Essential (primary) hypertension: Secondary | ICD-10-CM | POA: Diagnosis not present

## 2017-01-03 DIAGNOSIS — E7849 Other hyperlipidemia: Secondary | ICD-10-CM | POA: Diagnosis not present

## 2017-01-03 DIAGNOSIS — M109 Gout, unspecified: Secondary | ICD-10-CM | POA: Diagnosis not present

## 2017-01-03 DIAGNOSIS — E119 Type 2 diabetes mellitus without complications: Secondary | ICD-10-CM | POA: Diagnosis not present

## 2017-01-19 DIAGNOSIS — I10 Essential (primary) hypertension: Secondary | ICD-10-CM | POA: Diagnosis not present

## 2017-01-19 DIAGNOSIS — K219 Gastro-esophageal reflux disease without esophagitis: Secondary | ICD-10-CM | POA: Diagnosis not present

## 2017-01-19 DIAGNOSIS — H539 Unspecified visual disturbance: Secondary | ICD-10-CM | POA: Diagnosis not present

## 2017-01-19 DIAGNOSIS — E119 Type 2 diabetes mellitus without complications: Secondary | ICD-10-CM | POA: Diagnosis not present

## 2017-01-19 DIAGNOSIS — Z136 Encounter for screening for cardiovascular disorders: Secondary | ICD-10-CM | POA: Diagnosis not present

## 2017-01-19 DIAGNOSIS — M109 Gout, unspecified: Secondary | ICD-10-CM | POA: Diagnosis not present

## 2017-01-19 DIAGNOSIS — E785 Hyperlipidemia, unspecified: Secondary | ICD-10-CM | POA: Diagnosis not present

## 2017-01-19 DIAGNOSIS — Z0110 Encounter for examination of ears and hearing without abnormal findings: Secondary | ICD-10-CM | POA: Diagnosis not present

## 2017-02-16 DIAGNOSIS — L603 Nail dystrophy: Secondary | ICD-10-CM | POA: Diagnosis not present

## 2017-02-16 DIAGNOSIS — E1151 Type 2 diabetes mellitus with diabetic peripheral angiopathy without gangrene: Secondary | ICD-10-CM | POA: Diagnosis not present

## 2017-02-16 DIAGNOSIS — I739 Peripheral vascular disease, unspecified: Secondary | ICD-10-CM | POA: Diagnosis not present

## 2017-02-16 DIAGNOSIS — M79674 Pain in right toe(s): Secondary | ICD-10-CM | POA: Diagnosis not present

## 2017-02-16 DIAGNOSIS — B351 Tinea unguium: Secondary | ICD-10-CM | POA: Diagnosis not present

## 2017-02-16 DIAGNOSIS — M79675 Pain in left toe(s): Secondary | ICD-10-CM | POA: Diagnosis not present

## 2017-03-09 DIAGNOSIS — L600 Ingrowing nail: Secondary | ICD-10-CM | POA: Diagnosis not present

## 2017-03-30 DIAGNOSIS — L600 Ingrowing nail: Secondary | ICD-10-CM | POA: Diagnosis not present

## 2017-04-12 ENCOUNTER — Encounter (HOSPITAL_COMMUNITY): Payer: Self-pay | Admitting: Family Medicine

## 2017-04-12 ENCOUNTER — Ambulatory Visit (HOSPITAL_COMMUNITY)
Admission: EM | Admit: 2017-04-12 | Discharge: 2017-04-12 | Disposition: A | Discharge status: Home / Self Care | Payer: Medicare Other | Attending: Family Medicine | Admitting: Family Medicine

## 2017-04-12 DIAGNOSIS — L089 Local infection of the skin and subcutaneous tissue, unspecified: Secondary | ICD-10-CM | POA: Diagnosis not present

## 2017-04-12 MED ORDER — DOXYCYCLINE HYCLATE 100 MG PO CAPS: 100.0000 mg | ORAL_CAPSULE | Freq: Two times a day (BID) | ORAL | 0 refills | Status: AC

## 2017-04-12 NOTE — ED Triage Notes (Signed)
Pt here with hx of diabetes. He has discolored discolored right great toe. He had ingrown toe nail removed 3 weeks ago. Sensation intact.

## 2017-04-12 NOTE — Discharge Instructions (Signed)
Please take the antibiotic as prescribed.  Please take ibuprofen if you have pain.  Please keep your appointment this Friday with the Podiatry to follow up and to re-evaluate.

## 2017-04-12 NOTE — ED Provider Notes (Signed)
[place]    CSN: [id number] Arrival date & time: 04/12/17  1004     History   Chief Complaint Chief Complaint  Patient presents with  . Foot Problem    HPI Robert Cole is a 62 y.o. male.   With history of T2DM, hypertension and intellectual disability, brought in by sister who is the caregiver, with concern for infection of his feet. Patient had his right big toenail removed 4 weeks ago and left big toenail removed 2 weeks ago due to ingrown toenail. Sister noted the right medial toe started to developed discoloration 2 days ago which concerns her. Per sister, patient had no fever. He is able to ambulate. Patient denies any pain. Patient has follow up appointment scheduled in 3 days with his Podiatry at [facility] but sister didn't want to wait and felt that patient needed something sooner.       Past Medical History:  Diagnosis Date  . Diabetes mellitus without complication ([place])   . Hypertension     There are no active problems to display for this patient.   Past Surgical History:  Procedure Laterality Date  . none         Home Medications    Prior to Admission medications   Medication Sig Start Date End Date Taking? Authorizing Provider  allopurinol (ZYLOPRIM) 100 MG tablet Take 100 mg by mouth daily.    [provider]  amLODipine-benazepril (LOTREL) 10-20 MG per capsule Take 1 capsule by mouth daily.    [provider]  amoxicillin (AMOXIL) 500 MG capsule Take 500 mg by mouth 2 (two) times daily.    [provider]  clarithromycin (BIAXIN) 500 MG tablet Take 500 mg by mouth 2 (two) times daily.    [provider]  doxycycline (VIBRAMYCIN) 100 MG capsule Take 1 capsule (100 mg total) by mouth 2 (two) times daily for 10 days. 04/12/17 04/22/17  Barry Dienes, NP  HYDROcodone-acetaminophen (NORCO/VICODIN) 5-325 MG per tablet Take 1 tablet by mouth every 4 (four) hours as needed for moderate pain or severe  pain. 08/30/13   Clayton Bibles, PA-C  metFORMIN (GLUCOPHAGE) 500 MG tablet Take 250 mg by mouth 2 (two) times daily with a meal.    [provider]  predniSONE (DELTASONE) 50 MG tablet Take one tablet PO daily 08/30/13   Clayton Bibles, PA-C    Family History Family History  Problem Relation Age of Onset  . Cancer Other   . Diabetes Other   . Heart disease Other     Social History Social History   Tobacco Use  . Smoking status: Never Smoker  Substance Use Topics  . Alcohol use: No  . Drug use: No     Allergies   Patient has no known allergies.   Review of Systems Review of Systems  Constitutional:       See HPI     Physical Exam Triage Vital Signs ED Triage Vitals  Enc Vitals Group     BP 04/12/17 1042 (!) (P) 170/89     Pulse Rate 04/12/17 1042 77     Resp 04/12/17 1042 18     Temp 04/12/17 1042 98 F (36.7 C)     Temp src --      SpO2 04/12/17 1042 100 %     Weight --      Height --      Head Circumference --      Peak Flow --  Pain Score --      Pain Loc --      Pain Edu? --      Excl. in [place]? --    No data found.  Updated Vital Signs BP (!) (P) 170/89   Pulse 77   Temp 98 F (36.7 C)   Resp 18   SpO2 100%   Visual Acuity Right Eye Distance:   Left Eye Distance:   Bilateral Distance:    Right Eye Near:   Left Eye Near:    Bilateral Near:     Physical Exam  Constitutional: He is oriented to person, place, and time. He appears well-developed and well-nourished.  HENT:  Head: Normocephalic and atraumatic.  Neck: Normal range of motion. Neck supple.  Cardiovascular: Normal rate, regular rhythm and normal heart sounds.  Pulmonary/Chest: Effort normal and breath sounds normal.  Lymphadenopathy:    He has no cervical adenopathy.  Neurological: He is alert and oriented to person, place, and time.  Skin:  See pictures below.  Right foot and left foot noted to have multiple hyperpigmentation in several areas. Pulses intact. Has  sensation.  No swelling or erythema noted No pain on palpation.  Attempted but unsuccessful in performing diabetic monofilament test due to patient's mental retardation.   Psychiatric:  Poor historian due to mental retardation.   Nursing note and vitals reviewed.        UC Treatments / Results  Labs (all labs ordered are listed, but only abnormal results are displayed) Labs Reviewed - No data to display  EKG  EKG Interpretation None       Radiology No results found.  Procedures Procedures (including critical care time)  Medications Ordered in UC Medications - No data to display   Initial Impression / Assessment and Plan / UC Course  I have reviewed the triage vital signs and the nursing notes.  Pertinent labs & imaging results that were available during my care of the patient were reviewed by me and considered in my medical decision making (see chart for details).  Final Clinical Impressions(s) / UC Diagnoses   Final diagnoses:  Toe infection   1) Start doxycyline 100 mg BID x 10 days.  2) Take ibuprofen or tylenol for pain relief PRN.  3) Stop soaking your feet. Keep the area clean and dry daily. Cover it with gauze dressing.  4) Please keep your appointment this Friday with the podiatry and f/u with podiatry as scheduled for re-evaluation.   ED Discharge Orders        Ordered    doxycycline (VIBRAMYCIN) 100 MG capsule  2 times daily     04/12/17 1114     Controlled Substance Prescriptions [place] Controlled Substance Registry consulted? Not Applicable   Barry Dienes, NP 04/12/17 1131

## 2017-04-15 DIAGNOSIS — T8189XA Other complications of procedures, not elsewhere classified, initial encounter: Secondary | ICD-10-CM | POA: Diagnosis not present

## 2017-04-20 DIAGNOSIS — E119 Type 2 diabetes mellitus without complications: Secondary | ICD-10-CM | POA: Diagnosis not present

## 2017-04-20 DIAGNOSIS — E785 Hyperlipidemia, unspecified: Secondary | ICD-10-CM | POA: Diagnosis not present

## 2017-04-20 DIAGNOSIS — K219 Gastro-esophageal reflux disease without esophagitis: Secondary | ICD-10-CM | POA: Diagnosis not present

## 2017-04-20 DIAGNOSIS — I10 Essential (primary) hypertension: Secondary | ICD-10-CM | POA: Diagnosis not present

## 2017-04-20 DIAGNOSIS — M109 Gout, unspecified: Secondary | ICD-10-CM | POA: Diagnosis not present

## 2017-05-06 DIAGNOSIS — T8189XA Other complications of procedures, not elsewhere classified, initial encounter: Secondary | ICD-10-CM | POA: Diagnosis not present

## 2017-05-23 DIAGNOSIS — M79675 Pain in left toe(s): Secondary | ICD-10-CM | POA: Diagnosis not present

## 2017-05-23 DIAGNOSIS — B351 Tinea unguium: Secondary | ICD-10-CM | POA: Diagnosis not present

## 2017-05-23 DIAGNOSIS — M79674 Pain in right toe(s): Secondary | ICD-10-CM | POA: Diagnosis not present

## 2017-07-20 DIAGNOSIS — K219 Gastro-esophageal reflux disease without esophagitis: Secondary | ICD-10-CM | POA: Diagnosis not present

## 2017-07-20 DIAGNOSIS — E119 Type 2 diabetes mellitus without complications: Secondary | ICD-10-CM | POA: Diagnosis not present

## 2017-07-20 DIAGNOSIS — I10 Essential (primary) hypertension: Secondary | ICD-10-CM | POA: Diagnosis not present

## 2017-07-20 DIAGNOSIS — M109 Gout, unspecified: Secondary | ICD-10-CM | POA: Diagnosis not present

## 2017-07-20 DIAGNOSIS — E785 Hyperlipidemia, unspecified: Secondary | ICD-10-CM | POA: Diagnosis not present

## 2017-07-27 DIAGNOSIS — E119 Type 2 diabetes mellitus without complications: Secondary | ICD-10-CM | POA: Diagnosis not present

## 2017-07-27 DIAGNOSIS — M109 Gout, unspecified: Secondary | ICD-10-CM | POA: Diagnosis not present

## 2017-07-27 DIAGNOSIS — K219 Gastro-esophageal reflux disease without esophagitis: Secondary | ICD-10-CM | POA: Diagnosis not present

## 2017-07-27 DIAGNOSIS — I10 Essential (primary) hypertension: Secondary | ICD-10-CM | POA: Diagnosis not present

## 2017-07-27 DIAGNOSIS — E785 Hyperlipidemia, unspecified: Secondary | ICD-10-CM | POA: Diagnosis not present

## 2017-09-07 DIAGNOSIS — K219 Gastro-esophageal reflux disease without esophagitis: Secondary | ICD-10-CM | POA: Diagnosis not present

## 2017-09-07 DIAGNOSIS — I10 Essential (primary) hypertension: Secondary | ICD-10-CM | POA: Diagnosis not present

## 2017-09-07 DIAGNOSIS — M109 Gout, unspecified: Secondary | ICD-10-CM | POA: Diagnosis not present

## 2017-09-07 DIAGNOSIS — E785 Hyperlipidemia, unspecified: Secondary | ICD-10-CM | POA: Diagnosis not present

## 2017-09-07 DIAGNOSIS — E119 Type 2 diabetes mellitus without complications: Secondary | ICD-10-CM | POA: Diagnosis not present

## 2017-09-27 DIAGNOSIS — I739 Peripheral vascular disease, unspecified: Secondary | ICD-10-CM | POA: Diagnosis not present

## 2017-09-27 DIAGNOSIS — B351 Tinea unguium: Secondary | ICD-10-CM | POA: Diagnosis not present

## 2017-11-09 DIAGNOSIS — M109 Gout, unspecified: Secondary | ICD-10-CM | POA: Diagnosis not present

## 2017-11-09 DIAGNOSIS — K219 Gastro-esophageal reflux disease without esophagitis: Secondary | ICD-10-CM | POA: Diagnosis not present

## 2017-11-09 DIAGNOSIS — E119 Type 2 diabetes mellitus without complications: Secondary | ICD-10-CM | POA: Diagnosis not present

## 2017-11-09 DIAGNOSIS — I10 Essential (primary) hypertension: Secondary | ICD-10-CM | POA: Diagnosis not present

## 2017-11-09 DIAGNOSIS — E785 Hyperlipidemia, unspecified: Secondary | ICD-10-CM | POA: Diagnosis not present

## 2017-11-11 ENCOUNTER — Encounter: Payer: Self-pay | Admitting: Gastroenterology

## 2017-11-17 DIAGNOSIS — H2513 Age-related nuclear cataract, bilateral: Secondary | ICD-10-CM | POA: Diagnosis not present

## 2017-11-17 DIAGNOSIS — E139 Other specified diabetes mellitus without complications: Secondary | ICD-10-CM | POA: Diagnosis not present

## 2017-12-13 ENCOUNTER — Ambulatory Visit (AMBULATORY_SURGERY_CENTER): Payer: Self-pay | Admitting: *Deleted

## 2017-12-13 VITALS — Ht 66.0 in | Wt 165.0 lb

## 2017-12-13 DIAGNOSIS — Z1211 Encounter for screening for malignant neoplasm of colon: Secondary | ICD-10-CM

## 2017-12-13 NOTE — Progress Notes (Signed)
Pt's sister with patient during [place]. Patient denies any allergies to eggs or soy. Patient denies any problems with anesthesia/sedation. Patient denies any oxygen use at home. Patient denies taking any diet/weight loss medications or blood thinners. EMMI education offered, pt declined.

## 2017-12-27 ENCOUNTER — Encounter: Payer: Self-pay | Admitting: Gastroenterology

## 2017-12-27 ENCOUNTER — Ambulatory Visit (AMBULATORY_SURGERY_CENTER): Payer: Medicare Other | Admitting: Gastroenterology

## 2017-12-27 VITALS — BP 112/70 | HR 69 | Temp 98.7°F | Resp 16 | Ht 66.0 in | Wt 165.0 lb

## 2017-12-27 DIAGNOSIS — D122 Benign neoplasm of ascending colon: Secondary | ICD-10-CM

## 2017-12-27 DIAGNOSIS — Z1211 Encounter for screening for malignant neoplasm of colon: Secondary | ICD-10-CM | POA: Diagnosis not present

## 2017-12-27 MED ORDER — SODIUM CHLORIDE 0.9 % IV SOLN: 500.0000 mL | Freq: Once | INTRAVENOUS | Status: DC

## 2017-12-27 NOTE — Op Note (Signed)
[place] Patient Name: Artist Bloom Procedure Date: 12/27/2017 8:43 AM MRN: [id number] Endoscopist: Thornton Park MD, MD Age: 62 Referring MD:  Date of Birth: 04/06/1955 Gender: Male Account #: [id number] Procedure:                Colonoscopy Indications:              Screening for colorectal malignant neoplasm, This                            is the patient's first colonoscopy. Family history                            is unknown. There are no baseline GI symptoms. Medicines:                See the Anesthesia note for documentation of the                            administered medications Procedure:                Pre-Anesthesia Assessment:                           - Prior to the procedure, a History and Physical                            was performed, and patient medications and                            allergies were reviewed. The patient's tolerance of                            previous anesthesia was also reviewed. The risks                            and benefits of the procedure and the sedation                            options and risks were discussed with the patient.                            All questions were answered, and informed consent                            was obtained. Prior Anticoagulants: The patient has                            taken no previous anticoagulant or antiplatelet                            agents. ASA Grade Assessment: II - A patient with                            mild systemic disease. After reviewing the risks  and benefits, the patient was deemed in                            satisfactory condition to undergo the procedure.                           After obtaining informed consent, the colonoscope                            was passed under direct vision. Throughout the                            procedure, the patient's blood pressure, pulse, and                            oxygen  saturations were monitored continuously. The                            Colonoscope was introduced through the anus and                            advanced to the the terminal ileum, with                            identification of the appendiceal orifice and IC                            valve. The colonoscopy was performed without                            difficulty. The patient tolerated the procedure                            well. The quality of the bowel preparation was good. Scope In: 8:55:13 AM Scope Out: 9:09:24 AM Scope Withdrawal Time: 0 hours 10 minutes 28 seconds  Total Procedure Duration: 0 hours 14 minutes 11 seconds  Findings:                 Two sessile polyps were found in the ascending                            colon. The polyps were 1 to 3 mm in size. These                            polyps were removed with a cold biopsy forceps.                            Resection and retrieval were complete.                           The exam was otherwise without abnormality on                            direct and retroflexion views.  Complications:            No immediate complications. Estimated Blood Loss:     Estimated blood loss: none. Impression:               - Two 1 to 3 mm polyps in the ascending colon,                            removed with a cold biopsy forceps. Resected and                            retrieved.                           - The examination was otherwise normal on direct                            and retroflexion views. Recommendation:           - Discharge patient to home.                           - Resume previous diet.                           - Continue present medications.                           - Await pathology results.                           - Repeat colonoscopy in 5 years for surveillance if                            at least one polyp is adenomatous, otherwise                            continue with routine colon cancer  screening                            guidelines. Thornton Park MD, MD 12/27/2017 9:12:32 AM This report has been signed electronically.

## 2017-12-27 NOTE — Patient Instructions (Signed)
Handout given on polyps. You had 2 removed today.  YOU HAD AN ENDOSCOPIC PROCEDURE TODAY AT [facility]:   Refer to the procedure report that was given to you for any specific questions about what was found during the examination.  If the procedure report does not answer your questions, please call your gastroenterologist to clarify.  If you requested that your care partner not be given the details of your procedure findings, then the procedure report has been included in a sealed envelope for you to review at your convenience later.  YOU SHOULD EXPECT: Some feelings of bloating in the abdomen. Passage of more gas than usual.  Walking can help get rid of the air that was put into your GI tract during the procedure and reduce the bloating. If you had a lower endoscopy (such as a colonoscopy or flexible sigmoidoscopy) you may notice spotting of blood in your stool or on the toilet paper. If you underwent a bowel prep for your procedure, you may not have a normal bowel movement for a few days.  Please Note:  You might notice some irritation and congestion in your nose or some drainage.  This is from the oxygen used during your procedure.  There is no need for concern and it should clear up in a day or so.  SYMPTOMS TO REPORT IMMEDIATELY:   Following lower endoscopy (colonoscopy or flexible sigmoidoscopy):  Excessive amounts of blood in the stool  Significant tenderness or worsening of abdominal pains  Swelling of the abdomen that is new, acute  Fever of 100F or higher    For urgent or emergent issues, a gastroenterologist can be reached at any hour by calling [phone].   DIET:  We do recommend a small meal at first, but then you may proceed to your regular diet.  Drink plenty of fluids but you should avoid alcoholic beverages for 24 hours.  ACTIVITY:  You should plan to take it easy for the rest of today and you should NOT DRIVE or use heavy machinery until tomorrow  (because of the sedation medicines used during the test).    FOLLOW UP: Our staff will call the number listed on your records the next business day following your procedure to check on you and address any questions or concerns that you may have regarding the information given to you following your procedure. If we do not reach you, we will leave a message.  However, if you are feeling well and you are not experiencing any problems, there is no need to return our call.  We will assume that you have returned to your regular daily activities without incident.  If any biopsies were taken you will be contacted by phone or by letter within the next 1-3 weeks.  Please call us at [phone] if you have not heard about the biopsies in 3 weeks.    SIGNATURES/CONFIDENTIALITY: You and/or your care partner have signed paperwork which will be entered into your electronic medical record.  These signatures attest to the fact that that the information above on your After Visit Summary has been reviewed and is understood.  Full responsibility of the confidentiality of this discharge information lies with you and/or your care-partner.

## 2017-12-27 NOTE — Progress Notes (Signed)
Pt's states no medical or surgical changes since previsit or office visit.  Sister, Camdyn Beske at bedside for interview. Patient lives with patient. Patient and sister unsure of color of bowel movements after the prep. Patient stating results are clear not red.

## 2017-12-27 NOTE — Progress Notes (Signed)
Called to room to assist during endoscopic procedure.  Patient ID and intended procedure confirmed with present staff. Received instructions for my participation in the procedure from the performing physician.  

## 2017-12-27 NOTE — Progress Notes (Signed)
PT taken to PACU. Monitors in place. VSS. Report given to RN. 

## 2017-12-28 ENCOUNTER — Telehealth: Payer: Self-pay | Admitting: *Deleted

## 2017-12-28 ENCOUNTER — Telehealth: Payer: Self-pay

## 2017-12-28 NOTE — Telephone Encounter (Signed)
  Follow up Call-  Call back number 12/27/2017  Post procedure Call Back phone  # [phone]  Permission to leave phone message Yes  Some recent data might be hidden     Patient questions:  Message left to call us if necessary.  Second call.

## 2017-12-28 NOTE — Telephone Encounter (Signed)
Name identifier, left a message. 

## 2018-01-01 ENCOUNTER — Encounter: Payer: Self-pay | Admitting: Gastroenterology

## 2018-01-09 DIAGNOSIS — E119 Type 2 diabetes mellitus without complications: Secondary | ICD-10-CM | POA: Diagnosis not present

## 2018-01-09 DIAGNOSIS — M109 Gout, unspecified: Secondary | ICD-10-CM | POA: Diagnosis not present

## 2018-01-09 DIAGNOSIS — I10 Essential (primary) hypertension: Secondary | ICD-10-CM | POA: Diagnosis not present

## 2018-01-09 DIAGNOSIS — Z0001 Encounter for general adult medical examination with abnormal findings: Secondary | ICD-10-CM | POA: Diagnosis not present

## 2018-01-09 DIAGNOSIS — K219 Gastro-esophageal reflux disease without esophagitis: Secondary | ICD-10-CM | POA: Diagnosis not present

## 2018-06-20 DIAGNOSIS — K219 Gastro-esophageal reflux disease without esophagitis: Secondary | ICD-10-CM | POA: Diagnosis not present

## 2018-06-20 DIAGNOSIS — I10 Essential (primary) hypertension: Secondary | ICD-10-CM | POA: Diagnosis not present

## 2018-06-20 DIAGNOSIS — M109 Gout, unspecified: Secondary | ICD-10-CM | POA: Diagnosis not present

## 2018-06-20 DIAGNOSIS — E782 Mixed hyperlipidemia: Secondary | ICD-10-CM | POA: Diagnosis not present

## 2018-06-20 DIAGNOSIS — E119 Type 2 diabetes mellitus without complications: Secondary | ICD-10-CM | POA: Diagnosis not present

## 2018-08-30 ENCOUNTER — Other Ambulatory Visit: Payer: Self-pay

## 2018-08-30 ENCOUNTER — Ambulatory Visit (HOSPITAL_COMMUNITY)
Admission: EM | Admit: 2018-08-30 | Discharge: 2018-08-30 | Disposition: A | Discharge status: Home / Self Care | Payer: Medicare Other | Attending: Emergency Medicine | Admitting: Emergency Medicine

## 2018-08-30 ENCOUNTER — Encounter (HOSPITAL_COMMUNITY): Payer: Self-pay

## 2018-08-30 DIAGNOSIS — M109 Gout, unspecified: Secondary | ICD-10-CM | POA: Insufficient documentation

## 2018-08-30 LAB — BASIC METABOLIC PANEL
Anion gap: 12 (ref 5–15)
BUN: 14 mg/dL (ref 8–23)
CO2: 24 mmol/L (ref 22–32)
Calcium: 9.6 mg/dL (ref 8.9–10.3)
Chloride: 102 mmol/L (ref 98–111)
Creatinine, Ser: 1.09 mg/dL (ref 0.61–1.24)
GFR calc Af Amer: >60 mL/min (ref >60)
GFR calc non Af Amer: >60 mL/min (ref >60)
Glucose, Bld: 130 mg/dL — ABNORMAL HIGH (ref 70–99)
Potassium: 3.6 mmol/L (ref 3.5–5.1)
Sodium: 138 mmol/L (ref 135–145)

## 2018-08-30 MED ORDER — NAPROXEN 500 MG PO TABS: 500.0000 mg | ORAL_TABLET | Freq: Two times a day (BID) | ORAL | 0 refills | Status: AC

## 2018-08-30 MED ORDER — COLCHICINE 0.6 MG PO TABS: ORAL_TABLET | ORAL | 0 refills | Status: DC

## 2018-08-30 MED ORDER — PREDNISONE 20 MG PO TABS: 40.0000 mg | ORAL_TABLET | Freq: Every day | ORAL | 0 refills | Status: AC

## 2018-08-30 NOTE — ED Triage Notes (Signed)
Pt states his gout has flared up. Pt states he soaked his feet in Epson salt and hot water. ( pt has swelling in his left hand and both feet.)

## 2018-08-30 NOTE — ED Provider Notes (Signed)
HPI  SUBJECTIVE:  Robert Cole is a 63 y.o. male who presents with 2 weeks of left wrist and right ankle/ foot swelling, pain.  States that these areas were hypersensitive at first, but this ahs largely resolved.  He describes the pain as stabbing, constant.  No fevers.  No body aches, no increased temperature of the joinst, no erythema.  No numbness or tingling in the foot or hand.  No recent trauma.  He reports limitation of motion of his left wrist, but denies any limitation of motion in his right ankle.  Symptoms started after missing several days of allopurinol.  No dietary indiscretions.  He tried topical CBD oil with improvement in his symptoms.  Symptoms are worse with wearing shoes and moving his wrist.  No recent change in medications.  he is compliant with his blood pressure medications.  He has a past medical history of gout which was presumptively diagnosed in the ER in 2015 when he presented with right knee pain.  He had an elevated uric acid.  Arthrocentesis was negative for crystals, but records state that lab was unable to perform this analysis due to clotting.  He has been on allopurinol since.  He has a history of diabetes, hypertension.  No history of chronic kidney disease, peptic ulcer disease, GI bleed, septic joint.  Family history significant for multiple siblings with gout.  PMD: Benito Mccreedy, MD   Patient is accompanied by his sister today.   Past Medical History:  Diagnosis Date  . Diabetes mellitus without complication ([place])   . Hyperlipidemia   . Hypertension     Past Surgical History:  Procedure Laterality Date  . none      Family History  Problem Relation Age of Onset  . Cancer Other   . Diabetes Other   . Heart disease Other   . Colon polyps Sister   . Colon polyps Brother   . Colon cancer Neg Hx   . Esophageal cancer Neg Hx   . Rectal cancer Neg Hx   . Stomach cancer Neg Hx     Social History   Tobacco Use  . Smoking status: Never Smoker   . Smokeless tobacco: Never Used  Substance Use Topics  . Alcohol use: No  . Drug use: No    No current facility-administered medications for this encounter.   Current Outpatient Medications:  .  amLODipine-benazepril (LOTREL) 10-40 MG capsule, Take 1 capsule by mouth daily., Disp: , Rfl: 1 .  hydrochlorothiazide (HYDRODIURIL) 25 MG tablet, Take 25 mg by mouth daily., Disp: , Rfl:  .  metFORMIN (GLUCOPHAGE) 500 MG tablet, Take 250 mg by mouth 2 (two) times daily with a meal., Disp: , Rfl:  .  pravastatin (PRAVACHOL) 20 MG tablet, Take 20 mg by mouth daily., Disp: , Rfl:  .  predniSONE (DELTASONE) 20 MG tablet, Take 2 tablets (40 mg total) by mouth daily with breakfast for 5 days., Disp: 10 tablet, Rfl: 0  No Known Allergies   ROS  As noted in HPI.   Physical Exam  BP (!) 168/100 (BP Location: Right Arm)   Pulse (!) 112   Temp 98.5 F (36.9 C) (Temporal)   Resp 16   Wt 74.8 kg   SpO2 97%   BMI 26.63 kg/m   Constitutional: Well developed, well nourished, no acute distress Eyes:  EOMI, conjunctiva normal bilaterally HENT: Normocephalic, atraumatic,mucus membranes moist Respiratory: Normal inspiratory effort Cardiovascular: Normal rate GI: nondistended skin: No rash, skin intact Musculoskeletal:  L wrist swollen, mild diffuse swelling over the hand, distal radius NT, distal ulnar styloid NT, snuffbox NT, carpals NT, metacarpals NT, digits NT , TFCC NT .no pain with supination, no pain with pronation, no pain with radial / ulnar deviation.  Pain aggravated with wrist flexion.  No pain with wrist extension, Sensation LT to hand normal.  Skin intact over entire hand.  RP 2+.  No erythema, increased temperature.  R Ankle swelling, mild foot swelling.  No erythema, increased temperature.   Distal fibula NT, Medial malleolus NT,  Deltoid ligament medially NT,  Lateral ligaments NT, ATFL laterally NT, posterior tablofibular ligament laterallyNT , calcaneofibular ligament laterally  NT,  Achilles NT, calcaneus NT,  Proximal 5th metatarsal NT, Midfoot NT, distal NVI with baseline sensation / motor to foot intact.  no pain with dorsiflexion/plantar flexion. no pain with inversion/eversion. Pt able to bear weight in dept.  Neurologic: Alert & oriented x 3, no focal neuro deficits Psychiatric: Speech and behavior appropriate   ED Course   Medications - No data to display  Orders Placed This Encounter  Procedures  . Basic metabolic panel    Standing Status:   Standing    Number of Occurrences:   1    Results for orders placed or performed during the hospital encounter of 08/30/18 (from the past 24 hour(s))  Basic metabolic panel     Status: Abnormal   Collection Time: 08/30/18 12:06 PM  Result Value Ref Range   Sodium 138 135 - 145 mmol/L   Potassium 3.6 3.5 - 5.1 mmol/L   Chloride 102 98 - 111 mmol/L   CO2 24 22 - 32 mmol/L   Glucose, Bld 130 (H) 70 - 99 mg/dL   BUN 14 8 - 23 mg/dL   Creatinine, Ser 1.09 0.61 - 1.24 mg/dL   Calcium 9.6 8.9 - 10.3 mg/dL   GFR calc non Af Amer >60 >60 mL/min   GFR calc Af Amer >60 >60 mL/min   Anion gap 12 5 - 15   No results found.  ED Clinical Impression  1. Acute gout of multiple sites, unspecified cause      ED Assessment/Plan  Previous ER records and labs reviewed.  As noted in HPI.  Patient was seen in the ER on 08/30/2013 for right knee pain, he had some renal insufficiency.  BUN 29/creatinine 1.45, elevated uric acid.   No other labs for comparison.  This does not appear to be septic joint.  I suspect that this is gout.  Patient presents today with his sister.  She is the contact person.  We will call her with the lab results at [phone].  Patient is not on any PGP or CYP 3 A4 inhibitors.  Will prescribe NSAIDs,colchichine pending lab results.  Home with some prednisone.  Today's BMP patient has normal kidney function.  E prescribed Naprosyn and colchicine.  Discussed labs and new prescriptions with sister  after she confirmed patient's birthdate.  Emphasized importance of follow-up with primary care.    Discussed labs,  MDM, treatment plan, and plan for follow-up with family. Discussed sn/sx that should prompt return to the ED. family agrees with plan.   Meds ordered this encounter  Medications  . predniSONE (DELTASONE) 20 MG tablet    Sig: Take 2 tablets (40 mg total) by mouth daily with breakfast for 5 days.    Dispense:  10 tablet    Refill:  0    *This clinic note was created using Dragon  dictation software. Therefore, there may be occasional mistakes despite careful proofreading.   ?   Melynda Ripple, MD 08/30/18 1654

## 2018-08-30 NOTE — Discharge Instructions (Addendum)
I will call you at [phone] with the lab results.  I would like to give him colchicine which will abort this gout attack and an NSAID such as ibuprofen, Naprosyn or indomethacin, which will help with pain and swelling, but whether I can do it or not and the dosing depends on how well his kidneys work.  I will call these in based on his kidney function.  I am sending him home with prednisone which will help with pain and swelling, but it will elevate his sugars.  Make sure you monitor his blood glucose closely.  He needs to follow-up with his doctor ASAP, this could be coming from the hydrochlorothiazide.  Resume purine restricted diet, continue pushing plenty of fluids.

## 2018-09-01 ENCOUNTER — Telehealth (HOSPITAL_COMMUNITY): Payer: Self-pay | Admitting: Emergency Medicine

## 2018-09-01 NOTE — Telephone Encounter (Signed)
Per MD note, pt okay to take gout medicine if kidney function is okay, kidney function normal, pt contacted and made aware he can take the gout medicine and monitor his blood sugars as well. All questions answered.

## 2018-10-23 DIAGNOSIS — K219 Gastro-esophageal reflux disease without esophagitis: Secondary | ICD-10-CM | POA: Diagnosis not present

## 2018-10-23 DIAGNOSIS — E119 Type 2 diabetes mellitus without complications: Secondary | ICD-10-CM | POA: Diagnosis not present

## 2018-10-23 DIAGNOSIS — E782 Mixed hyperlipidemia: Secondary | ICD-10-CM | POA: Diagnosis not present

## 2018-10-23 DIAGNOSIS — M109 Gout, unspecified: Secondary | ICD-10-CM | POA: Diagnosis not present

## 2018-10-23 DIAGNOSIS — I10 Essential (primary) hypertension: Secondary | ICD-10-CM | POA: Diagnosis not present

## 2018-10-23 DIAGNOSIS — Z125 Encounter for screening for malignant neoplasm of prostate: Secondary | ICD-10-CM | POA: Diagnosis not present

## 2018-12-04 DIAGNOSIS — M109 Gout, unspecified: Secondary | ICD-10-CM | POA: Diagnosis not present

## 2018-12-04 DIAGNOSIS — I10 Essential (primary) hypertension: Secondary | ICD-10-CM | POA: Diagnosis not present

## 2018-12-04 DIAGNOSIS — K219 Gastro-esophageal reflux disease without esophagitis: Secondary | ICD-10-CM | POA: Diagnosis not present

## 2018-12-04 DIAGNOSIS — Z23 Encounter for immunization: Secondary | ICD-10-CM | POA: Diagnosis not present

## 2018-12-04 DIAGNOSIS — E782 Mixed hyperlipidemia: Secondary | ICD-10-CM | POA: Diagnosis not present

## 2018-12-04 DIAGNOSIS — E119 Type 2 diabetes mellitus without complications: Secondary | ICD-10-CM | POA: Diagnosis not present

## 2019-01-10 DIAGNOSIS — E119 Type 2 diabetes mellitus without complications: Secondary | ICD-10-CM | POA: Diagnosis not present

## 2019-01-10 DIAGNOSIS — M109 Gout, unspecified: Secondary | ICD-10-CM | POA: Diagnosis not present

## 2019-01-10 DIAGNOSIS — K219 Gastro-esophageal reflux disease without esophagitis: Secondary | ICD-10-CM | POA: Diagnosis not present

## 2019-01-10 DIAGNOSIS — E782 Mixed hyperlipidemia: Secondary | ICD-10-CM | POA: Diagnosis not present

## 2019-01-10 DIAGNOSIS — Z0001 Encounter for general adult medical examination with abnormal findings: Secondary | ICD-10-CM | POA: Diagnosis not present

## 2019-01-10 DIAGNOSIS — I10 Essential (primary) hypertension: Secondary | ICD-10-CM | POA: Diagnosis not present

## 2019-04-11 DIAGNOSIS — E119 Type 2 diabetes mellitus without complications: Secondary | ICD-10-CM | POA: Diagnosis not present

## 2019-04-11 DIAGNOSIS — I10 Essential (primary) hypertension: Secondary | ICD-10-CM | POA: Diagnosis not present

## 2019-04-11 DIAGNOSIS — E782 Mixed hyperlipidemia: Secondary | ICD-10-CM | POA: Diagnosis not present

## 2019-04-11 DIAGNOSIS — K219 Gastro-esophageal reflux disease without esophagitis: Secondary | ICD-10-CM | POA: Diagnosis not present

## 2019-04-11 DIAGNOSIS — M109 Gout, unspecified: Secondary | ICD-10-CM | POA: Diagnosis not present

## 2019-04-27 ENCOUNTER — Ambulatory Visit: Payer: Medicare Other | Attending: Internal Medicine

## 2019-04-27 DIAGNOSIS — Z23 Encounter for immunization: Secondary | ICD-10-CM

## 2019-04-27 NOTE — Progress Notes (Signed)
   Covid-19 Vaccination Clinic  Name:  Robert Cole    MRN: [id number] DOB: 27-Apr-1955  04/27/2019  Mr. Robert Cole was observed post Covid-19 immunization for 15 minutes without incident. He was provided with Vaccine Information Sheet and instruction to access the V-Safe system.   Mr. Robert Cole was instructed to call 911 with any severe reactions post vaccine: Marland Kitchen Difficulty breathing  . Swelling of face and throat  . A fast heartbeat  . A bad rash all over body  . Dizziness and weakness   Immunizations Administered    Name Date Dose VIS Date Route   Pfizer COVID-19 Vaccine 04/27/2019  8:52 AM 0.3 mL 01/12/2019 Intramuscular   Manufacturer: [place]   Lot: [id number]   [place]: [id number]

## 2019-05-21 ENCOUNTER — Ambulatory Visit: Payer: Medicare Other | Attending: Internal Medicine

## 2019-05-21 DIAGNOSIS — Z23 Encounter for immunization: Secondary | ICD-10-CM

## 2019-05-21 NOTE — Progress Notes (Signed)
   Covid-19 Vaccination Clinic  Name:  Robert Cole    MRN: [id number] DOB: [birth year]  05/21/2019  Robert Cole was observed post Covid-19 immunization for 15 minutes without incident. He was provided with Vaccine Information Sheet and instruction to access the V-Safe system.   Robert Cole was instructed to call 911 with any severe reactions post vaccine: Marland Kitchen Difficulty breathing  . Swelling of face and throat  . A fast heartbeat  . A bad rash all over body  . Dizziness and weakness   Immunizations Administered    Name Date Dose VIS Date Route   Pfizer COVID-19 Vaccine 05/21/2019 12:10 PM 0.3 mL 03/28/2018 Intramuscular   Manufacturer: [place]   Lot: [id number]   [place]: [id number]

## 2019-08-23 DIAGNOSIS — F39 Unspecified mood [affective] disorder: Secondary | ICD-10-CM | POA: Diagnosis not present

## 2019-08-23 DIAGNOSIS — I10 Essential (primary) hypertension: Secondary | ICD-10-CM | POA: Diagnosis not present

## 2019-08-23 DIAGNOSIS — M109 Gout, unspecified: Secondary | ICD-10-CM | POA: Diagnosis not present

## 2019-08-23 DIAGNOSIS — E782 Mixed hyperlipidemia: Secondary | ICD-10-CM | POA: Diagnosis not present

## 2019-08-23 DIAGNOSIS — E119 Type 2 diabetes mellitus without complications: Secondary | ICD-10-CM | POA: Diagnosis not present

## 2019-08-23 DIAGNOSIS — K219 Gastro-esophageal reflux disease without esophagitis: Secondary | ICD-10-CM | POA: Diagnosis not present

## 2020-01-16 DIAGNOSIS — M109 Gout, unspecified: Secondary | ICD-10-CM | POA: Diagnosis not present

## 2020-01-16 DIAGNOSIS — E782 Mixed hyperlipidemia: Secondary | ICD-10-CM | POA: Diagnosis not present

## 2020-01-16 DIAGNOSIS — I10 Essential (primary) hypertension: Secondary | ICD-10-CM | POA: Diagnosis not present

## 2020-01-16 DIAGNOSIS — E119 Type 2 diabetes mellitus without complications: Secondary | ICD-10-CM | POA: Diagnosis not present

## 2020-01-16 DIAGNOSIS — K219 Gastro-esophageal reflux disease without esophagitis: Secondary | ICD-10-CM | POA: Diagnosis not present

## 2020-01-16 DIAGNOSIS — Z23 Encounter for immunization: Secondary | ICD-10-CM | POA: Diagnosis not present

## 2020-04-16 DIAGNOSIS — K219 Gastro-esophageal reflux disease without esophagitis: Secondary | ICD-10-CM | POA: Diagnosis not present

## 2020-04-16 DIAGNOSIS — M109 Gout, unspecified: Secondary | ICD-10-CM | POA: Diagnosis not present

## 2020-04-16 DIAGNOSIS — I10 Essential (primary) hypertension: Secondary | ICD-10-CM | POA: Diagnosis not present

## 2020-04-16 DIAGNOSIS — E119 Type 2 diabetes mellitus without complications: Secondary | ICD-10-CM | POA: Diagnosis not present

## 2020-04-16 DIAGNOSIS — E782 Mixed hyperlipidemia: Secondary | ICD-10-CM | POA: Diagnosis not present

## 2020-05-08 DIAGNOSIS — E782 Mixed hyperlipidemia: Secondary | ICD-10-CM | POA: Diagnosis not present

## 2020-05-08 DIAGNOSIS — E119 Type 2 diabetes mellitus without complications: Secondary | ICD-10-CM | POA: Diagnosis not present

## 2020-05-08 DIAGNOSIS — K219 Gastro-esophageal reflux disease without esophagitis: Secondary | ICD-10-CM | POA: Diagnosis not present

## 2020-05-08 DIAGNOSIS — M109 Gout, unspecified: Secondary | ICD-10-CM | POA: Diagnosis not present

## 2020-05-08 DIAGNOSIS — I10 Essential (primary) hypertension: Secondary | ICD-10-CM | POA: Diagnosis not present

## 2020-11-19 DIAGNOSIS — I10 Essential (primary) hypertension: Secondary | ICD-10-CM | POA: Diagnosis not present

## 2020-11-19 DIAGNOSIS — K219 Gastro-esophageal reflux disease without esophagitis: Secondary | ICD-10-CM | POA: Diagnosis not present

## 2020-11-19 DIAGNOSIS — M109 Gout, unspecified: Secondary | ICD-10-CM | POA: Diagnosis not present

## 2020-11-19 DIAGNOSIS — E119 Type 2 diabetes mellitus without complications: Secondary | ICD-10-CM | POA: Diagnosis not present

## 2020-11-19 DIAGNOSIS — Z125 Encounter for screening for malignant neoplasm of prostate: Secondary | ICD-10-CM | POA: Diagnosis not present

## 2020-11-19 DIAGNOSIS — Z23 Encounter for immunization: Secondary | ICD-10-CM | POA: Diagnosis not present

## 2020-11-19 DIAGNOSIS — Z0000 Encounter for general adult medical examination without abnormal findings: Secondary | ICD-10-CM | POA: Diagnosis not present

## 2020-11-19 DIAGNOSIS — E782 Mixed hyperlipidemia: Secondary | ICD-10-CM | POA: Diagnosis not present

## 2020-12-30 DIAGNOSIS — E785 Hyperlipidemia, unspecified: Secondary | ICD-10-CM | POA: Diagnosis not present

## 2020-12-30 DIAGNOSIS — Z809 Family history of malignant neoplasm, unspecified: Secondary | ICD-10-CM | POA: Diagnosis not present

## 2020-12-30 DIAGNOSIS — Z7984 Long term (current) use of oral hypoglycemic drugs: Secondary | ICD-10-CM | POA: Diagnosis not present

## 2020-12-30 DIAGNOSIS — K08409 Partial loss of teeth, unspecified cause, unspecified class: Secondary | ICD-10-CM | POA: Diagnosis not present

## 2020-12-30 DIAGNOSIS — H548 Legal blindness, as defined in USA: Secondary | ICD-10-CM | POA: Diagnosis not present

## 2020-12-30 DIAGNOSIS — Z823 Family history of stroke: Secondary | ICD-10-CM | POA: Diagnosis not present

## 2020-12-30 DIAGNOSIS — N529 Male erectile dysfunction, unspecified: Secondary | ICD-10-CM | POA: Diagnosis not present

## 2020-12-30 DIAGNOSIS — I10 Essential (primary) hypertension: Secondary | ICD-10-CM | POA: Diagnosis not present

## 2020-12-30 DIAGNOSIS — Z8249 Family history of ischemic heart disease and other diseases of the circulatory system: Secondary | ICD-10-CM | POA: Diagnosis not present

## 2020-12-30 DIAGNOSIS — Z833 Family history of diabetes mellitus: Secondary | ICD-10-CM | POA: Diagnosis not present

## 2020-12-30 DIAGNOSIS — M109 Gout, unspecified: Secondary | ICD-10-CM | POA: Diagnosis not present

## 2020-12-30 DIAGNOSIS — E1169 Type 2 diabetes mellitus with other specified complication: Secondary | ICD-10-CM | POA: Diagnosis not present

## 2021-01-12 DIAGNOSIS — Z0001 Encounter for general adult medical examination with abnormal findings: Secondary | ICD-10-CM | POA: Diagnosis not present

## 2021-01-12 DIAGNOSIS — K219 Gastro-esophageal reflux disease without esophagitis: Secondary | ICD-10-CM | POA: Diagnosis not present

## 2021-01-12 DIAGNOSIS — M109 Gout, unspecified: Secondary | ICD-10-CM | POA: Diagnosis not present

## 2021-01-12 DIAGNOSIS — E119 Type 2 diabetes mellitus without complications: Secondary | ICD-10-CM | POA: Diagnosis not present

## 2021-01-12 DIAGNOSIS — E782 Mixed hyperlipidemia: Secondary | ICD-10-CM | POA: Diagnosis not present

## 2021-01-12 DIAGNOSIS — I10 Essential (primary) hypertension: Secondary | ICD-10-CM | POA: Diagnosis not present

## 2021-02-20 DIAGNOSIS — E119 Type 2 diabetes mellitus without complications: Secondary | ICD-10-CM | POA: Diagnosis not present

## 2021-02-20 DIAGNOSIS — K219 Gastro-esophageal reflux disease without esophagitis: Secondary | ICD-10-CM | POA: Diagnosis not present

## 2021-02-20 DIAGNOSIS — E782 Mixed hyperlipidemia: Secondary | ICD-10-CM | POA: Diagnosis not present

## 2021-02-20 DIAGNOSIS — I10 Essential (primary) hypertension: Secondary | ICD-10-CM | POA: Diagnosis not present

## 2021-03-24 DIAGNOSIS — E782 Mixed hyperlipidemia: Secondary | ICD-10-CM | POA: Diagnosis not present

## 2021-03-24 DIAGNOSIS — E119 Type 2 diabetes mellitus without complications: Secondary | ICD-10-CM | POA: Diagnosis not present

## 2021-03-24 DIAGNOSIS — I10 Essential (primary) hypertension: Secondary | ICD-10-CM | POA: Diagnosis not present

## 2021-03-24 DIAGNOSIS — K219 Gastro-esophageal reflux disease without esophagitis: Secondary | ICD-10-CM | POA: Diagnosis not present

## 2021-04-07 DIAGNOSIS — I10 Essential (primary) hypertension: Secondary | ICD-10-CM | POA: Diagnosis not present

## 2021-04-07 DIAGNOSIS — E782 Mixed hyperlipidemia: Secondary | ICD-10-CM | POA: Diagnosis not present

## 2021-04-07 DIAGNOSIS — E119 Type 2 diabetes mellitus without complications: Secondary | ICD-10-CM | POA: Diagnosis not present

## 2021-04-22 DIAGNOSIS — I10 Essential (primary) hypertension: Secondary | ICD-10-CM | POA: Diagnosis not present

## 2021-05-19 DIAGNOSIS — K219 Gastro-esophageal reflux disease without esophagitis: Secondary | ICD-10-CM | POA: Diagnosis not present

## 2021-05-19 DIAGNOSIS — Z8249 Family history of ischemic heart disease and other diseases of the circulatory system: Secondary | ICD-10-CM | POA: Diagnosis not present

## 2021-05-19 DIAGNOSIS — R32 Unspecified urinary incontinence: Secondary | ICD-10-CM | POA: Diagnosis not present

## 2021-05-19 DIAGNOSIS — E785 Hyperlipidemia, unspecified: Secondary | ICD-10-CM | POA: Diagnosis not present

## 2021-05-19 DIAGNOSIS — Z6830 Body mass index (BMI) 30.0-30.9, adult: Secondary | ICD-10-CM | POA: Diagnosis not present

## 2021-05-19 DIAGNOSIS — Z7984 Long term (current) use of oral hypoglycemic drugs: Secondary | ICD-10-CM | POA: Diagnosis not present

## 2021-05-19 DIAGNOSIS — E669 Obesity, unspecified: Secondary | ICD-10-CM | POA: Diagnosis not present

## 2021-05-19 DIAGNOSIS — Z809 Family history of malignant neoplasm, unspecified: Secondary | ICD-10-CM | POA: Diagnosis not present

## 2021-05-19 DIAGNOSIS — Z823 Family history of stroke: Secondary | ICD-10-CM | POA: Diagnosis not present

## 2021-05-19 DIAGNOSIS — E1151 Type 2 diabetes mellitus with diabetic peripheral angiopathy without gangrene: Secondary | ICD-10-CM | POA: Diagnosis not present

## 2021-05-19 DIAGNOSIS — G3184 Mild cognitive impairment, so stated: Secondary | ICD-10-CM | POA: Diagnosis not present

## 2021-05-19 DIAGNOSIS — I10 Essential (primary) hypertension: Secondary | ICD-10-CM | POA: Diagnosis not present

## 2021-08-18 ENCOUNTER — Encounter (INDEPENDENT_AMBULATORY_CARE_PROVIDER_SITE_OTHER): Payer: Self-pay | Admitting: Primary Care

## 2021-08-18 ENCOUNTER — Ambulatory Visit (INDEPENDENT_AMBULATORY_CARE_PROVIDER_SITE_OTHER): Payer: Medicare HMO | Admitting: Primary Care

## 2021-08-18 VITALS — BP 145/79 | HR 80 | Temp 98.9°F | Resp 16 | Ht 64.5 in | Wt 178.6 lb

## 2021-08-18 DIAGNOSIS — E1165 Type 2 diabetes mellitus with hyperglycemia: Secondary | ICD-10-CM

## 2021-08-18 DIAGNOSIS — B351 Tinea unguium: Secondary | ICD-10-CM

## 2021-08-18 DIAGNOSIS — I10 Essential (primary) hypertension: Secondary | ICD-10-CM

## 2021-08-18 MED ORDER — METFORMIN HCL ER 500 MG PO TB24: 500.0000 mg | ORAL_TABLET | Freq: Two times a day (BID) | ORAL | 1 refills | Status: DC

## 2021-08-18 MED ORDER — AMLODIPINE BESY-BENAZEPRIL HCL 10-40 MG PO CAPS: 1.0000 | ORAL_CAPSULE | Freq: Every day | ORAL | 1 refills | Status: DC

## 2021-08-18 MED ORDER — EMPAGLIFLOZIN 25 MG PO TABS: 25.0000 mg | ORAL_TABLET | Freq: Every day | ORAL | 1 refills | Status: DC

## 2021-08-18 MED ORDER — GLIPIZIDE 10 MG PO TABS: 10.0000 mg | ORAL_TABLET | Freq: Two times a day (BID) | ORAL | 1 refills | Status: DC

## 2021-08-18 NOTE — Progress Notes (Signed)
New Patient Office Visit  Subjective    Patient ID: Robert Cole, male    DOB: 1955/04/18  Age: 66 y.o. MRN: [id number]  CC:    HPI Robert Cole presents to establish care. Patient main concern is toe.   Outpatient Encounter Medications as of 08/18/2021  Medication Sig   empagliflozin (JARDIANCE) 25 MG TABS tablet Take 1 tablet (25 mg total) by mouth daily before breakfast.   glipiZIDE (GLUCOTROL) 10 MG tablet Take 1 tablet (10 mg total) by mouth 2 (two) times daily before a meal.   metFORMIN (GLUCOPHAGE-XR) 500 MG 24 hr tablet Take 1 tablet (500 mg total) by mouth 2 (two) times daily after a meal.   amLODipine-benazepril (LOTREL) 10-40 MG capsule Take 1 capsule by mouth daily.   colchicine 0.6 MG tablet 2 tabs po x 1, then one tab po 1 hour later   hydrochlorothiazide (HYDRODIURIL) 25 MG tablet Take 25 mg by mouth daily.   pravastatin (PRAVACHOL) 20 MG tablet Take 20 mg by mouth daily.   [DISCONTINUED] amLODipine-benazepril (LOTREL) 10-40 MG capsule Take 1 capsule by mouth daily.   [DISCONTINUED] metFORMIN (GLUCOPHAGE) 500 MG tablet Take 250 mg by mouth 2 (two) times daily with a meal.   No facility-administered encounter medications on file as of 08/18/2021.    Past Medical History:  Diagnosis Date   Diabetes mellitus without complication ([place])    Hyperlipidemia    Hypertension     Past Surgical History:  Procedure Laterality Date   none      Family History  Problem Relation Age of Onset   Cancer Other    Diabetes Other    Heart disease Other    Colon polyps Sister    Colon polyps Brother    Colon cancer Neg Hx    Esophageal cancer Neg Hx    Rectal cancer Neg Hx    Stomach cancer Neg Hx     Social History   Socioeconomic History   Marital status: Single    Spouse name: Not on file   Number of children: Not on file   Years of education: Not on file   Highest education level: Not on file  Occupational History   Not on file  Tobacco Use   Smoking  status: Never   Smokeless tobacco: Never  Vaping Use   Vaping Use: Never used  Substance and Sexual Activity   Alcohol use: No   Drug use: No   Sexual activity: Not on file  Other Topics Concern   Not on file  Social History Narrative   Not on file   Social Determinants of Health   Financial Resource Strain: Not on file  Food Insecurity: Not on file  Transportation Needs: Not on file  Physical Activity: Not on file  Stress: Not on file  Social Connections: Not on file  Intimate Partner Violence: Not on file    ROS Comprehensive ROS Pertinent positive and negative noted in HPI       Objective   BP (!) 145/79 (BP Location: Left Arm, Patient Position: Sitting, Cuff Size: Normal)   Pulse 80   Temp 98.9 F (37.2 C) (Oral)   Resp 16   Ht 5' 4.5" (1.638 m)   Wt 178 lb 9.6 oz (81 kg)   SpO2 97%   BMI 30.18 kg/m    Physical Exam Vitals reviewed.  Constitutional:      Appearance: He is obese.  HENT:     Head: Normocephalic.  Right Ear: Tympanic membrane normal.     Left Ear: Tympanic membrane normal.     Nose: Nose normal.  Eyes:     Extraocular Movements: Extraocular movements intact.     Pupils: Pupils are equal, round, and reactive to light.  Cardiovascular:     Rate and Rhythm: Normal rate and regular rhythm.  Pulmonary:     Effort: Pulmonary effort is normal.     Breath sounds: Normal breath sounds.  Abdominal:     General: Bowel sounds are normal. There is distension.     Palpations: Abdomen is soft.  Musculoskeletal:        General: Normal range of motion.     Cervical back: Normal range of motion and neck supple.  Skin:    General: Skin is warm and dry.  Neurological:     Mental Status: He is alert and oriented to person, place, and time.  Psychiatric:        Mood and Affect: Mood normal.        Behavior: Behavior normal.        Thought Content: Thought content normal.       Assessment & Plan:  Diagnoses and all orders for this  visit:  Uncontrolled type 2 diabetes mellitus with hyperglycemia ([place]) Discussed  co- morbidities with uncontrol diabetes  Complications -diabetic retinopathy, (close your eyes ? What do you see nothing) nephropathy decrease in kidney function- can lead to dialysis-on a machine 3 days a week to filter your kidney, neuropathy- numbness and tinging in your hands and feet,  increase risk of heart attack and stroke, and amputation due to decrease wound healing and circulation. Decrease your risk by taking medication daily as prescribed, monitor carbohydrates- foods that are high in carbohydrates are the following rice, potatoes, breads, sugars, and pastas.  Reduction in the intake (eating) will assist in lowering your blood sugars. Exercise daily at least 30 minutes daily.  -     glipiZIDE (GLUCOTROL) 10 MG tablet; Take 1 tablet (10 mg total) by mouth 2 (two) times daily before a meal. -     empagliflozin (JARDIANCE) 25 MG TABS tablet; Take 1 tablet (25 mg total) by mouth daily before breakfast. -     metFORMIN (GLUCOPHAGE-XR) 500 MG 24 hr tablet; Take 1 tablet (500 mg total) by mouth 2 (two) times daily after a meal.  Essential hypertension BP goal - < 140/90Explained that having normal blood pressure is the goal and medications are helping to get to goal and maintain normal blood pressure. DIET: Limit salt intake, read nutrition labels to check salt content, limit fried and high fatty foods  Avoid using multisymptom OTC cold preparations that generally contain sudafed which can rise BP. Consult with pharmacist on best cold relief products to use for persons with HTN EXERCISE Discussed incorporating exercise such as walking - 30 minutes most days of the week and can do in 10 minute intervals    -     amLODipine-benazepril (LOTREL) 10-40 MG capsule; Take 1 capsule by mouth daily.  Onychomycosis -     Ambulatory referral to Podiatry    Return in about 3 months (around 11/18/2021) for DM/HTN .    Kerin Perna, NP

## 2021-08-18 NOTE — Patient Instructions (Signed)

## 2021-09-03 ENCOUNTER — Ambulatory Visit (INDEPENDENT_AMBULATORY_CARE_PROVIDER_SITE_OTHER): Payer: Medicare HMO | Admitting: Podiatry

## 2021-09-03 ENCOUNTER — Encounter: Payer: Self-pay | Admitting: Podiatry

## 2021-09-03 DIAGNOSIS — B351 Tinea unguium: Secondary | ICD-10-CM

## 2021-09-03 DIAGNOSIS — B353 Tinea pedis: Secondary | ICD-10-CM

## 2021-09-03 DIAGNOSIS — M79674 Pain in right toe(s): Secondary | ICD-10-CM

## 2021-09-03 DIAGNOSIS — M79675 Pain in left toe(s): Secondary | ICD-10-CM

## 2021-09-03 MED ORDER — KETOCONAZOLE 2 % EX CREA: 1.0000 | TOPICAL_CREAM | Freq: Every day | CUTANEOUS | 2 refills | Status: AC

## 2021-09-06 NOTE — Progress Notes (Signed)
  Subjective:  Patient ID: Robert Cole, male    DOB: 04-Jul-1955,  MRN: [id number]  Chief Complaint  Patient presents with   Diabetes    np diabetic foot care/ nail trim   Nail Problem    66 y.o. male presents with the above complaint. History confirmed with patient.  Nails are thickened and elongated.  Also has significant dry skin  Objective:  Physical Exam: warm, good capillary refill, no trophic changes or ulcerative lesions, normal DP and PT pulses, normal sensory exam, and tinea pedis. Left Foot: dystrophic yellowed discolored nail plates with subungual debris Right Foot: dystrophic yellowed discolored nail plates with subungual debris   Assessment:   1. Pain due to onychomycosis of toenails of both feet   2. Tinea pedis of both feet      Plan:  Patient was evaluated and treated and all questions answered.  Patient educated on diabetes. Discussed proper diabetic foot care and discussed risks and complications of disease. Educated patient in depth on reasons to return to the office immediately should he/she discover anything concerning or new on the feet. All questions answered. Discussed proper shoes as well.   Discussed the etiology and treatment options for the condition in detail with the patient. Educated patient on the topical and oral treatment options for mycotic nails. Recommended debridement of the nails today. Sharp and mechanical debridement performed of all painful and mycotic nails today. Nails debrided in length and thickness using a nail nipper to level of comfort. Discussed treatment options including appropriate shoe gear. Follow up as needed for painful nails.   Discussed the etiology and treatment options for tinea pedis.  Discussed topical and oral treatment.  Recommended topical treatment with 2% ketoconazole cream.  This was sent to the patient's pharmacy.  Also discussed appropriate foot hygiene, use of antifungal spray such as Tinactin in shoes, as  well as cleaning foot surfaces such as showers and bathroom floors with bleach.   Return in about 3 months (around 12/04/2021) for at risk diabetic foot care.

## 2021-09-22 ENCOUNTER — Encounter: Payer: Self-pay | Admitting: Pharmacist

## 2021-09-22 ENCOUNTER — Ambulatory Visit: Payer: Medicare HMO | Attending: Primary Care | Admitting: Pharmacist

## 2021-09-22 VITALS — BP 165/92

## 2021-09-22 DIAGNOSIS — E1165 Type 2 diabetes mellitus with hyperglycemia: Secondary | ICD-10-CM | POA: Diagnosis not present

## 2021-09-22 MED ORDER — ONETOUCH VERIO VI STRP: ORAL_STRIP | 2 refills | Status: AC

## 2021-09-22 MED ORDER — ONETOUCH VERIO W/DEVICE KIT: PACK | 0 refills | Status: AC

## 2021-09-22 MED ORDER — ONETOUCH DELICA PLUS LANCET33G MISC: 2 refills | Status: AC

## 2021-09-22 NOTE — Progress Notes (Signed)
S:     No chief complaint on file.  Robert Cole is a 66 y.o. male who presents for diabetes evaluation, education, and management.  PMH is significant for T2DM, HLD, hypertension, gout.  Patient was referred and last seen by Primary Care Provider, Juluis Mire, on 08/18/2021.  At that visit, pt was started on glipizide and Jardiance.   Today, patient arrives in good spirits and presents with assistance from his sister who also serves as his caretaker. She reports DM was dx ~10 years ago. He has only taken PO medications for DM and denies ever taking insulin. He has no history of pancreatitis or thyroid cancer. No history of ACS, CAD, CHF, CKD, or stroke.   Family/Social History:  -FHX: DM, heart disease, cancer  -Tobacco: never smoker  -Alcohol: none reported   Current diabetes medications include: glipizide 10 mg BID, Jardiance 25 mg daily, metformin 500 mg XR BID,  Current hypertension medications include: has amlodipine-benazepril, HCTZ, and carvedilol listed on his profile but he is not taking these.  Current hyperlipidemia medications include: atorvastatin 80 mg (not taking)  Patient reports adherence to taking all DM medications as prescribed. Not currently taking HTN or HLD medications as his sister is unuaware if he is supposed to be on these or not.   Insurance coverage: [employer]  Patient denies hypoglycemic events.  Reported home fasting blood sugars: not checking  Reported 2 hour post-meal/random blood sugars: not checking  Patient denies nocturia (nighttime urination).  Patient denies neuropathy (nerve pain). Patient denies visual changes. Patient reports self foot exams.   Patient reported dietary habits:  -Sister is trying to increase the amount of non-starchy vegetables pt consumes -Trying to limit snack foods such as potato chips and popcorn but admits this is hard for him  Patient-reported exercise habits:  -None reported    O:   ROS  Physical  Exam  7 day average blood glucose: no meter with him  No CGM in place  No results found for: "HGBA1C" Vitals:   09/22/21 1709  BP: (!) 165/92    Lipid Panel  No results found for: "CHOL", "TRIG", "HDL", "CHOLHDL", "VLDL", "LDLCALC", "LDLDIRECT"  Clinical Atherosclerotic Cardiovascular Disease (ASCVD): No  The ASCVD Risk score (Arnett DK, et al., 2019) failed to calculate for the following reasons:   Cannot find a previous HDL lab   Cannot find a previous total cholesterol lab   A/P: Diabetes longstanding. Current degree of control unknown. Patient is able to verbalize appropriate hypoglycemia management plan. Medication adherence appears appropriate. -Continued current regimen.  -Extensively discussed pathophysiology of diabetes, recommended lifestyle interventions, dietary effects on blood sugar control.  -Counseled on s/sx of and management of hypoglycemia.  -[employer] supplies sent for home CBG monitoring.  -CMP14+eGFR, HgA1c.   ASCVD risk - primary prevention in patient with diabetes. Needs lipid panel. At least moderate intensity statin indicated. Will wait until we have today's lab results before prescribing. -Lipid,   Hypertension longstanding currently uncontrolled. Blood pressure goal of <130/80 mmHg. Medication adherence: pt is not currently taking antihypertensives. Emphasized that he will need to restart these but not to do so until I've called him tomorrow with lab results.  -CMP14+eGFR.  -Anticipate restarting amlodipine-benazepril, HCTZ, and carvedilol pending lab results.   Written patient instructions provided. Patient verbalized understanding of treatment plan.  Total time in face to face counseling 30 minutes.    Follow-up:  Pharmacist in 1 month. PCP clinic visit in October.   Acquanetta Belling Ausdall,  PharmD, [facility], [place] [phone]

## 2021-09-23 ENCOUNTER — Other Ambulatory Visit: Payer: Self-pay | Admitting: Pharmacist

## 2021-09-23 ENCOUNTER — Telehealth: Payer: Self-pay | Admitting: Pharmacist

## 2021-09-23 ENCOUNTER — Other Ambulatory Visit (INDEPENDENT_AMBULATORY_CARE_PROVIDER_SITE_OTHER): Payer: Self-pay | Admitting: Primary Care

## 2021-09-23 DIAGNOSIS — I10 Essential (primary) hypertension: Secondary | ICD-10-CM

## 2021-09-23 DIAGNOSIS — E782 Mixed hyperlipidemia: Secondary | ICD-10-CM

## 2021-09-23 LAB — LIPID PANEL
Chol/HDL Ratio: 5.8 ratio — ABNORMAL HIGH (ref 0.0–5.0)
Cholesterol, Total: 227 mg/dL — ABNORMAL HIGH (ref 100–199)
HDL: 39 mg/dL — ABNORMAL LOW (ref >39)
LDL Chol Calc (NIH): 165 mg/dL — ABNORMAL HIGH (ref 0–99)
Triglycerides: 126 mg/dL (ref 0–149)
VLDL Cholesterol Cal: 23 mg/dL (ref 5–40)

## 2021-09-23 LAB — CMP14+EGFR
ALT: 34 IU/L (ref 0–44)
AST: 30 IU/L (ref 0–40)
Albumin/Globulin Ratio: 1.7 (ref 1.2–2.2)
Albumin: 4.8 g/dL (ref 3.9–4.9)
Alkaline Phosphatase: 74 IU/L (ref 44–121)
BUN/Creatinine Ratio: 8 — ABNORMAL LOW (ref 10–24)
BUN: 10 mg/dL (ref 8–27)
Bilirubin Total: 0.5 mg/dL (ref 0.0–1.2)
CO2: 19 mmol/L — ABNORMAL LOW (ref 20–29)
Calcium: 10 mg/dL (ref 8.6–10.2)
Chloride: 100 mmol/L (ref 96–106)
Creatinine, Ser: 1.2 mg/dL (ref 0.76–1.27)
Globulin, Total: 2.8 g/dL (ref 1.5–4.5)
Glucose: 126 mg/dL — ABNORMAL HIGH (ref 70–99)
Potassium: 4 mmol/L (ref 3.5–5.2)
Sodium: 139 mmol/L (ref 134–144)
Total Protein: 7.6 g/dL (ref 6.0–8.5)
eGFR: 68 mL/min/{1.73_m2} (ref >59)

## 2021-09-23 LAB — MICROALBUMIN / CREATININE URINE RATIO
Creatinine, Urine: 77.8 mg/dL
Microalb/Creat Ratio: 355 mg/g creat — ABNORMAL HIGH (ref 0–29)
Microalbumin, Urine: 276.5 ug/mL

## 2021-09-23 LAB — HEMOGLOBIN A1C
Est. average glucose Bld gHb Est-mCnc: 194 mg/dL
Hgb A1c MFr Bld: 8.4 % — ABNORMAL HIGH (ref 4.8–5.6)

## 2021-09-23 MED ORDER — ATORVASTATIN CALCIUM 80 MG PO TABS: 80.0000 mg | ORAL_TABLET | Freq: Every day | ORAL | 1 refills | Status: DC

## 2021-09-23 MED ORDER — HYDROCHLOROTHIAZIDE 25 MG PO TABS: 25.0000 mg | ORAL_TABLET | Freq: Every day | ORAL | 1 refills | Status: AC

## 2021-09-23 MED ORDER — CARVEDILOL 25 MG PO TABS: 25.0000 mg | ORAL_TABLET | Freq: Two times a day (BID) | ORAL | 1 refills | Status: AC

## 2021-09-23 NOTE — Telephone Encounter (Signed)
Call placed to patient's sister. Confirmed I was speaking to her using two different identifiers. She is able to receive results from patient's blood work.   I introduced myself and the reason for my call. Lab results reviewed with pt's sister. His renal function, electrolytes, and liver function look good. His BP yesterday was very high and his urine is significant for microalbuminuria. I emphasized the importance of BP and DM control. I instructed her to restart his Lotrel, carvedilol, and HCTZ. Additionally, his LDL was above goal. Told her to restart atorvastatin. Finally, his A1c is 8.4% but his DM regimen was just adjusted. Hence, I will hold off on any additional changes.   Information routed to PCP for review.

## 2021-09-24 ENCOUNTER — Telehealth (INDEPENDENT_AMBULATORY_CARE_PROVIDER_SITE_OTHER): Payer: Self-pay

## 2021-09-24 NOTE — Telephone Encounter (Signed)
-----   Message from Kerin Perna, NP sent at 09/23/2021 10:10 PM EDT ----- Your cholesterol is high, Increase risk of heart attack and/or stroke.  To reduce your Cholesterol , Remember - more fruits and vegetables, more fish, and limit red meat and dairy products. More soy, nuts, beans, barley, lentils, oats and plant sterol ester enriched margarine instead of butter. I also encourage eliminating sugar and processed food. Take your Atorvastatin '80mg'$  nightly  Your micro albumin is elevated you are on medication amlodipine/benazepril which suppose to protect your kidneys. Take this Bp medication daily.

## 2021-09-24 NOTE — Telephone Encounter (Signed)
Contacted patient and per DPR spoke with his sister. Provided lab results and diet advise. She verbalized understanding. Nat Christen, CMA

## 2021-10-27 ENCOUNTER — Ambulatory Visit: Payer: Medicare HMO | Admitting: Pharmacist

## 2021-11-18 ENCOUNTER — Ambulatory Visit (INDEPENDENT_AMBULATORY_CARE_PROVIDER_SITE_OTHER): Payer: Medicare HMO | Admitting: Primary Care

## 2021-11-18 ENCOUNTER — Encounter (INDEPENDENT_AMBULATORY_CARE_PROVIDER_SITE_OTHER): Payer: Self-pay | Admitting: Primary Care

## 2021-11-18 ENCOUNTER — Telehealth (INDEPENDENT_AMBULATORY_CARE_PROVIDER_SITE_OTHER): Payer: Self-pay | Admitting: Primary Care

## 2021-11-18 VITALS — BP 147/97 | HR 82 | Resp 16 | Wt 168.8 lb

## 2021-11-18 DIAGNOSIS — Z114 Encounter for screening for human immunodeficiency virus [HIV]: Secondary | ICD-10-CM | POA: Diagnosis not present

## 2021-11-18 DIAGNOSIS — Z6830 Body mass index (BMI) 30.0-30.9, adult: Secondary | ICD-10-CM | POA: Diagnosis not present

## 2021-11-18 DIAGNOSIS — K5900 Constipation, unspecified: Secondary | ICD-10-CM

## 2021-11-18 DIAGNOSIS — E6609 Other obesity due to excess calories: Secondary | ICD-10-CM

## 2021-11-18 DIAGNOSIS — I10 Essential (primary) hypertension: Secondary | ICD-10-CM

## 2021-11-18 DIAGNOSIS — Z1159 Encounter for screening for other viral diseases: Secondary | ICD-10-CM | POA: Diagnosis not present

## 2021-11-18 DIAGNOSIS — E119 Type 2 diabetes mellitus without complications: Secondary | ICD-10-CM | POA: Diagnosis not present

## 2021-11-18 DIAGNOSIS — R69 Illness, unspecified: Secondary | ICD-10-CM | POA: Diagnosis not present

## 2021-11-18 MED ORDER — SENNOSIDES-DOCUSATE SODIUM 8.6-50 MG PO TABS: 1.0000 | ORAL_TABLET | Freq: Every day | ORAL | 1 refills | Status: AC

## 2021-11-18 MED ORDER — AMLODIPINE BESYLATE 10 MG PO TABS: 10.0000 mg | ORAL_TABLET | Freq: Every day | ORAL | 1 refills | Status: DC

## 2021-11-18 NOTE — Patient Instructions (Signed)
Calorie Counting for Weight Loss Calories are units of energy. Your body needs a certain number of calories from food to keep going throughout the day. When you eat or drink more calories than your body needs, your body stores the extra calories mostly as fat. When you eat or drink fewer calories than your body needs, your body burns fat to get the energy it needs. Calorie counting means keeping track of how many calories you eat and drink each day. Calorie counting can be helpful if you need to lose weight. If you eat fewer calories than your body needs, you should lose weight. Ask your health care provider what a healthy weight is for you. For calorie counting to work, you will need to eat the right number of calories each day to lose a healthy amount of weight per week. A dietitian can help you figure out how many calories you need in a day and will suggest ways to reach your calorie goal. A healthy amount of weight to lose each week is usually 1-2 lb (0.5-0.9 kg). This usually means that your daily calorie intake should be reduced by 500-750 calories. Eating 1,200-1,500 calories a day can help most women lose weight. Eating 1,500-1,800 calories a day can help most men lose weight. What do I need to know about calorie counting? Work with your health care provider or dietitian to determine how many calories you should get each day. To meet your daily calorie goal, you will need to: Find out how many calories are in each food that you would like to eat. Try to do this before you eat. Decide how much of the food you plan to eat. Keep a food log. Do this by writing down what you ate and how many calories it had. To successfully lose weight, it is important to balance calorie counting with a healthy lifestyle that includes regular activity. Where do I find calorie information?  The number of calories in a food can be found on a Nutrition Facts label. If a food does not have a Nutrition Facts label, try  to look up the calories online or ask your dietitian for help. Remember that calories are listed per serving. If you choose to have more than one serving of a food, you will have to multiply the calories per serving by the number of servings you plan to eat. For example, the label on a package of bread might say that a serving size is 1 slice and that there are 90 calories in a serving. If you eat 1 slice, you will have eaten 90 calories. If you eat 2 slices, you will have eaten 180 calories. How do I keep a food log? After each time that you eat, record the following in your food log as soon as possible: What you ate. Be sure to include toppings, sauces, and other extras on the food. How much you ate. This can be measured in cups, ounces, or number of items. How many calories were in each food and drink. The total number of calories in the food you ate. Keep your food log near you, such as in a pocket-sized notebook or on an app or website on your mobile phone. Some programs will calculate calories for you and show you how many calories you have left to meet your daily goal. What are some portion-control tips? Know how many calories are in a serving. This will help you know how many servings you can have of a certain   food. Use a measuring cup to measure serving sizes. You could also try weighing out portions on a kitchen scale. With time, you will be able to estimate serving sizes for some foods. Take time to put servings of different foods on your favorite plates or in your favorite bowls and cups so you know what a serving looks like. Try not to eat straight from a food's packaging, such as from a bag or box. Eating straight from the package makes it hard to see how much you are eating and can lead to overeating. Put the amount you would like to eat in a cup or on a plate to make sure you are eating the right portion. Use smaller plates, glasses, and bowls for smaller portions and to prevent  overeating. Try not to multitask. For example, avoid watching TV or using your computer while eating. If it is time to eat, sit down at a table and enjoy your food. This will help you recognize when you are full. It will also help you be more mindful of what and how much you are eating. What are tips for following this plan? Reading food labels Check the calorie count compared with the serving size. The serving size may be smaller than what you are used to eating. Check the source of the calories. Try to choose foods that are high in protein, fiber, and vitamins, and low in saturated fat, trans fat, and sodium. Shopping Read nutrition labels while you shop. This will help you make healthy decisions about which foods to buy. Pay attention to nutrition labels for low-fat or fat-free foods. These foods sometimes have the same number of calories or more calories than the full-fat versions. They also often have added sugar, starch, or salt to make up for flavor that was removed with the fat. Make a grocery list of lower-calorie foods and stick to it. Cooking Try to cook your favorite foods in a healthier way. For example, try baking instead of frying. Use low-fat dairy products. Meal planning Use more fruits and vegetables. One-half of your plate should be fruits and vegetables. Include lean proteins, such as chicken, turkey, and fish. Lifestyle Each week, aim to do one of the following: 150 minutes of moderate exercise, such as walking. 75 minutes of vigorous exercise, such as running. General information Know how many calories are in the foods you eat most often. This will help you calculate calorie counts faster. Find a way of tracking calories that works for you. Get creative. Try different apps or programs if writing down calories does not work for you. What foods should I eat?  Eat nutritious foods. It is better to have a nutritious, high-calorie food, such as an avocado, than a food with  few nutrients, such as a bag of potato chips. Use your calories on foods and drinks that will fill you up and will not leave you hungry soon after eating. Examples of foods that fill you up are nuts and nut butters, vegetables, lean proteins, and high-fiber foods such as whole grains. High-fiber foods are foods with more than 5 g of fiber per serving. Pay attention to calories in drinks. Low-calorie drinks include water and unsweetened drinks. The items listed above may not be a complete list of foods and beverages you can eat. Contact a dietitian for more information. What foods should I limit? Limit foods or drinks that are not good sources of vitamins, minerals, or protein or that are high in unhealthy fats. These   include: Candy. Other sweets. Sodas, specialty coffee drinks, alcohol, and juice. The items listed above may not be a complete list of foods and beverages you should avoid. Contact a dietitian for more information. How do I count calories when eating out? Pay attention to portions. Often, portions are much larger when eating out. Try these tips to keep portions smaller: Consider sharing a meal instead of getting your own. If you get your own meal, eat only half of it. Before you start eating, ask for a container and put half of your meal into it. When available, consider ordering smaller portions from the menu instead of full portions. Pay attention to your food and drink choices. Knowing the way food is cooked and what is included with the meal can help you eat fewer calories. If calories are listed on the menu, choose the lower-calorie options. Choose dishes that include vegetables, fruits, whole grains, low-fat dairy products, and lean proteins. Choose items that are boiled, broiled, grilled, or steamed. Avoid items that are buttered, battered, fried, or served with cream sauce. Items labeled as crispy are usually fried, unless stated otherwise. Choose water, low-fat milk,  unsweetened iced tea, or other drinks without added sugar. If you want an alcoholic beverage, choose a lower-calorie option, such as a glass of wine or light beer. Ask for dressings, sauces, and syrups on the side. These are usually high in calories, so you should limit the amount you eat. If you want a salad, choose a garden salad and ask for grilled meats. Avoid extra toppings such as bacon, cheese, or fried items. Ask for the dressing on the side, or ask for olive oil and vinegar or lemon to use as dressing. Estimate how many servings of a food you are given. Knowing serving sizes will help you be aware of how much food you are eating at restaurants. Where to find more information Centers for Disease Control and Prevention: www.cdc.gov [place] Department of Agriculture: myplate.gov Summary Calorie counting means keeping track of how many calories you eat and drink each day. If you eat fewer calories than your body needs, you should lose weight. A healthy amount of weight to lose per week is usually 1-2 lb (0.5-0.9 kg). This usually means reducing your daily calorie intake by 500-750 calories. The number of calories in a food can be found on a Nutrition Facts label. If a food does not have a Nutrition Facts label, try to look up the calories online or ask your dietitian for help. Use smaller plates, glasses, and bowls for smaller portions and to prevent overeating. Use your calories on foods and drinks that will fill you up and not leave you hungry shortly after a meal. This information is not intended to replace advice given to you by your health care provider. Make sure you discuss any questions you have with your health care provider. Document Revised: 03/01/2019 Document Reviewed: 03/01/2019 Elsevier Patient Education  2023 [employer]  

## 2021-11-19 LAB — HCV AB W REFLEX TO QUANT PCR: HCV Ab: NONREACTIVE

## 2021-11-19 LAB — HIV ANTIBODY (ROUTINE TESTING W REFLEX): HIV Screen 4th Generation wRfx: NONREACTIVE

## 2021-11-19 LAB — HCV INTERPRETATION

## 2021-11-22 MED ORDER — LISINOPRIL 40 MG PO TABS: 40.0000 mg | ORAL_TABLET | Freq: Every day | ORAL | 3 refills | Status: DC

## 2021-11-22 NOTE — Progress Notes (Signed)
Robert Cole, is a 66 y.o. male  EBR:[id number]  GSU:[id number]  DOB - [birth year]  Chief Complaint  Patient presents with   Diabetes   Hypertension       Subjective:   Robert Cole is a 66 y.o. male obese male here today for a follow up visit.  Management of hypertension elevated today 147/97-systolic has improved since last visit.  Patient has No headache, No chest pain, No abdominal pain - No Nausea, No new weakness tingling or numbness, No Cough - shortness of breath.  Management of diabetes-he denies polyuria, polydipsia, polyphasia or vision changes.  No problems updated.  Not on File  Past Medical History:  Diagnosis Date   Diabetes mellitus without complication ([place])    Hyperlipidemia    Hypertension     Current Outpatient Medications on File Prior to Visit  Medication Sig Dispense Refill   atorvastatin (LIPITOR) 80 MG tablet Take 1 tablet (80 mg total) by mouth daily. 90 tablet 1   Blood Glucose Monitoring Suppl (ONETOUCH VERIO) w/Device KIT Use to check blood sugar three times daily. 1 kit 0   carvedilol (COREG) 25 MG tablet Take 1 tablet (25 mg total) by mouth 2 (two) times daily. 180 tablet 1   empagliflozin (JARDIANCE) 25 MG TABS tablet Take 1 tablet (25 mg total) by mouth daily before breakfast. 90 tablet 1   glipiZIDE (GLUCOTROL) 10 MG tablet Take 1 tablet (10 mg total) by mouth 2 (two) times daily before a meal. 180 tablet 1   glucose blood (ONETOUCH VERIO) test strip Use to check blood sugar three times daily. 100 each 2   hydrochlorothiazide (HYDRODIURIL) 25 MG tablet Take 1 tablet (25 mg total) by mouth daily. 90 tablet 1   ketoconazole (NIZORAL) 2 % cream Apply 1 Application topically daily. 60 g 2   Lancets (ONETOUCH DELICA PLUS [id number]) MISC Use to check blood sugar three times daily. 100 each 2   metFORMIN (GLUCOPHAGE-XR) 500 MG 24 hr tablet Take 1 tablet (500 mg total) by mouth 2 (two) times daily after a meal. 180  tablet 1   No current facility-administered medications on file prior to visit.    Objective:   Vitals:   11/18/21 1037  BP: (!) 147/97  Pulse: 82  Resp: 16  SpO2: 98%  Weight: 168 lb 12.8 oz (76.6 kg)   Comprehensive ROS Pertinent positive and negative noted in HPI   Exam General appearance : Awake, alert, not in any distress. Speech Clear. Not toxic looking HEENT: Atraumatic and Normocephalic, pupils equally reactive to light and accomodation Neck: Supple, no JVD. No cervical lymphadenopathy.  Chest: Good air entry bilaterally, no added sounds  CVS: S1 S2 regular, no murmurs.  Abdomen: Bowel sounds present, Non tender and not distended with no gaurding, rigidity or rebound. Extremities: B/L Lower Ext shows no edema, both legs are warm to touch Neurology: Awake alert, and oriented X 3,  Non focal Skin: No Rash  Data Review Lab Results  Component Value Date   HGBA1C 8.4 (H) 09/22/2021    Assessment & Plan   1. Essential hypertension Blood pressure is not at goal 140/90 Explained that having normal blood pressure is the goal and medications are helping to get to goal and maintain normal blood pressure. DIET: Limit salt intake, read nutrition labels to check salt content, limit fried and high fatty foods  Avoid using multisymptom OTC cold preparations that generally contain sudafed which can rise BP. Consult with pharmacist  on best cold relief products to use for persons with HTN EXERCISE Discussed incorporating exercise such as walking - 30 minutes most days of the week and can do in 10 minute intervals     Lotrel, carvedilol, and HCTZ - amLODipine (NORVASC) 10 MG tablet; Take 1 tablet (10 mg total) by mouth daily.  Dispense: 90 tablet; Refill: 1 and lisinopril 4090 tablets with 1 refill  2. Type 2 diabetes mellitus without complication, without long-term current use of insulin (HCC) A1c is 8.0 not controlled. - educated on lifestyle modifications, including but not  limited to diet choices and adding exercise to daily routine.  Current diabetes medications include: glipizide 10 mg BID, Jardiance 25 mg daily, metformin 500 mg XR BID, follow-up was not done with clinical pharmacist to reevaluate diabetes and hypertension - Ambulatory referral to Ophthalmology  3. Need fo- HCV Ab w Reflex to Quant PCR  4. Screening for HIV (human immunodeficiency virus) - HIV Antibody (routine testing w rflx)  5. Class 1 obesity due to excess calories with serious comorbidity and body mass index (BMI) of 30.0 to 30.9 in adult .Obesity is 30-39 indicating an excess in caloric intake or underlining conditions. This may have lead to co-morbidities.(Diabetes and hypertension) educated on lifestyle modifications of diet and exercise which may reduce obesity.    6. Constipation, unspecified constipation type MANAGEMENT OF CHRONIC CONSTIPATION  Drink fluids in the recommended amount everyday. Recommend amount is 8 cups of water daily. Do not replace water with Gatorade or Powerade as these should only be used when you are dehydrated.  Eat lots of high fiber foods-fruits, veggies, bran and whole grain instead of white bread Be active everyday. Inactivity makes constipation worse. Add psyllium daily (Metamucil) which comes in capsules now. Start very low dose and work up to recommended dose on bottle daily. Stay away from [place] or any magnesium containing laxative, unless you need it to clear things out rarely. It is an addictive laxative and your gut will become dependent on it. If that is not working, I would start Miralax, which you can buy in generic 17 gms daily. It's a powder and not an "addictive laxative". Take it every day and titrate the dose up or down to get the daily Bm. We will consider the use of other pharmacological treatments should the above recommendations prove to be unsuccessful.   - senna-docusate (SENOKOT S) 8.6-50 MG tablet; Take 1 tablet by mouth  daily.  Dispense: 90 tablet; Refill: 1   Follow-up with Luke 4 to 6 weeks to reevaluate blood pressure and diabetes Patient have been counseled extensively about nutrition and exercise. Other issues discussed during this visit include: low cholesterol diet, weight control and daily exercise, foot care, annual eye examinations at Ophthalmology, importance of adherence with medications and regular follow-up. We also discussed long term complications of uncontrolled diabetes and hypertension.   Return in about 3 months (around 02/18/2022) for DM/BP/ fasting.  The patient was given clear instructions to go to ER or return to medical center if symptoms don't improve, worsen or new problems develop. The patient verbalized understanding. The patient was told to call to get lab results if they haven't heard anything in the next week.   This note has been created with Surveyor, quantity. Any transcriptional errors are unintentional.   Kerin Perna, NP 11/22/2021, 4:49 PM

## 2021-11-25 NOTE — Progress Notes (Signed)
S:     PCP: Juluis Mire  66 y.o. male who presents for diabetes evaluation, education, and management. PMH is significant for T2DM, HTN, HLD, and gout.   Patient was referred and last seen by Primary Care Provider, NP Juluis Mire, on 08/18/21.  At last visit with clinical pharmacist, he reported that he had not been taking his HTN or HLD medications. Labs were taken and patient was instructed to restart his medications at that time.   At last visit with his PCP, BP was elevated and Lotrel was switched for lisinopril and amlodipine.   Today, patient arrives with some hesitancy throughout the visit and presents with his sister. He presented with his home BP monitor and BG meter to learn how to use both.   He reports that he would like a referral to a counselor. Denies any suicidal thoughts but endorses homicidal ideation. He states he would not like to discuss any of this further at this time, but would like to discuss with a counselor.   His sister reports that he is crushing up his carvedilol, metformin, and atorvastatin and mixing with water as these medications are too large for him to swallow. He is not open to the thought of an injectable medication at this time. Currently taking HCTZ with PMH of gout; would like to get a uric acid today. No complaints for gout sx or flares.   Patient reports Diabetes was diagnosed about 10 years ago. He has no history of pancreatitis or thyroid cancer. No history of ACS, CAD, CHF, CKD, or stroke.     Family/Social History:  -FHX: DM, heart disease, cancer  -Tobacco: never smoker  -Alcohol: none reported    Current diabetes medications include: glipizide 10 mg BID, Jardiance 25 mg daily, metformin 500 mg XR BID Current hypertension medications include: amlodipine '10mg'$  once daily, HCTZ '25mg'$  once daily, lisinopril '40mg'$  once daily(not taking), carvedilol '25mg'$  BID Current hyperlipidemia medications include: atorvastatin 80 mg  Patient  reports adherence to taking all medications, though not as prescribed.  He has not started the lisinopril but has been taking previous Rx of Lotrel 10-'40mg'$  once daily AND amlodipine '10mg'$  once daily.   Insurance coverage: Aetna  Patient denies hypoglycemic events.  Reported home fasting blood sugars: not checking at home  Patient reports nocturia (nighttime urination).  Patient reports neuropathy (nerve pain). Patient denies visual changes. Patient reports self foot exams.   Patient reported dietary habits:  -Sister is trying to increase the amount of non-starchy vegetables pt consumes -mainly fish -has not had fried foods in almost 60 days  -Trying to limit snack foods such as potato chips and popcorn but admits this is hard for him   Patient-reported exercise habits:  -None reported    O:   Random BG in clinic with his meter today: 100 Vitals:   11/26/21 1515  BP: (!) 146/85  Pulse: 89     Lab Results  Component Value Date   HGBA1C 8.4 (H) 09/22/2021   There were no vitals filed for this visit.  Lipid Panel     Component Value Date/Time   CHOL 227 (H) 09/22/2021 1106   TRIG 126 09/22/2021 1106   HDL 39 (L) 09/22/2021 1106   CHOLHDL 5.8 (H) 09/22/2021 1106   LDLCALC 165 (H) 09/22/2021 1106    Clinical Atherosclerotic Cardiovascular Disease (ASCVD): No  The 10-year ASCVD risk score (Arnett DK, et al., 2019) is: 38.7%   Values used to calculate the score:  Age: 88 years     Sex: Male     Is Non-Hispanic African American: Yes     Diabetic: Yes     Tobacco smoker: No     Systolic Blood Pressure: 322 mmHg     Is BP treated: Yes     HDL Cholesterol: 39 mg/dL     Total Cholesterol: 227 mg/dL    A/P: Diabetes longstanding currently uncontrolled. Patient is able to verbalize appropriate hypoglycemia management plan. Medication adherence appears appropriate. Will switch metformin to the immediate release formulation as he is crushing up the medication and not  getting the formulation benefit.  -Continued glipizide '10mg'$  once daily -Continued SGLT2-I Jardiance '25mg'$  once daily  -Increased dose of metformin to '1000mg'$  BID  -Extensively discussed pathophysiology of diabetes, recommended lifestyle interventions, dietary effects on blood sugar control.  -Counseled on s/sx of and management of hypoglycemia.  -Next A1c anticipated November.   ASCVD risk - primary prevention in patient with diabetes. Last LDL is 165 not at goal of <70 mg/dL. ASCVD risk factors include DM, HTN and 10-year ASCVD risk score of 38.7. high intensity statin indicated.  -Continued atorvastatin 80 mg.   Hypertension longstanding currently uncontrolled. Blood pressure goal of <130/80 mmHg. Medication adherence appropriate, but not with the medications as prescribed.  -Emphasized to stop taking Lotrel -Discontinued lisinopril -Initiated valsartan '320mg'$  once daily -Continued amlodipine '10mg'$  once daily -Continued HCTZ '25mg'$  once daily -Obtain CMP and uric acid  Written patient instructions provided. Patient verbalized understanding of treatment plan.  Total time in face to face counseling 50 minutes.    Follow-up:  Pharmacist in one month.  Maryan Puls, PharmD PGY-1 [facility] Pharmacy Resident

## 2021-11-26 ENCOUNTER — Ambulatory Visit: Payer: Medicare HMO | Attending: Primary Care | Admitting: Pharmacist

## 2021-11-26 VITALS — BP 146/85 | HR 89

## 2021-11-26 DIAGNOSIS — E1165 Type 2 diabetes mellitus with hyperglycemia: Secondary | ICD-10-CM

## 2021-11-26 DIAGNOSIS — I10 Essential (primary) hypertension: Secondary | ICD-10-CM

## 2021-11-26 MED ORDER — VALSARTAN 320 MG PO TABS: 320.0000 mg | ORAL_TABLET | Freq: Every day | ORAL | 1 refills | Status: DC

## 2021-11-26 MED ORDER — METFORMIN HCL 1000 MG PO TABS: 1000.0000 mg | ORAL_TABLET | Freq: Two times a day (BID) | ORAL | 1 refills | Status: DC

## 2021-11-26 NOTE — Telephone Encounter (Signed)
LCSWA called patient today to introduce herself and to assess patients' mental health needs. Patient did not answer the phone. LCSWA was not able to leave a brief message, mailbox was full. Patient was referred by PCP for depression.

## 2021-11-27 ENCOUNTER — Telehealth (INDEPENDENT_AMBULATORY_CARE_PROVIDER_SITE_OTHER): Payer: Self-pay

## 2021-11-27 LAB — CMP14+EGFR
ALT: 33 IU/L (ref 0–44)
AST: 32 IU/L (ref 0–40)
Albumin/Globulin Ratio: 1.6 (ref 1.2–2.2)
Albumin: 4.9 g/dL (ref 3.9–4.9)
Alkaline Phosphatase: 76 IU/L (ref 44–121)
BUN/Creatinine Ratio: 15 (ref 10–24)
BUN: 17 mg/dL (ref 8–27)
Bilirubin Total: 0.6 mg/dL (ref 0.0–1.2)
CO2: 24 mmol/L (ref 20–29)
Calcium: 9.7 mg/dL (ref 8.6–10.2)
Chloride: 97 mmol/L (ref 96–106)
Creatinine, Ser: 1.15 mg/dL (ref 0.76–1.27)
Globulin, Total: 3 g/dL (ref 1.5–4.5)
Glucose: 105 mg/dL — ABNORMAL HIGH (ref 70–99)
Potassium: 4.2 mmol/L (ref 3.5–5.2)
Sodium: 139 mmol/L (ref 134–144)
Total Protein: 7.9 g/dL (ref 6.0–8.5)
eGFR: 71 mL/min/{1.73_m2} (ref >59)

## 2021-11-27 LAB — URIC ACID: Uric Acid: 7.8 mg/dL (ref 3.8–8.4)

## 2021-11-27 NOTE — Telephone Encounter (Signed)
Contacted pt to go over lab results spoke with pt sister and provided results and she doesn't have any questions or concerns

## 2021-12-04 ENCOUNTER — Ambulatory Visit: Payer: Medicare HMO | Admitting: Podiatry

## 2022-01-04 ENCOUNTER — Ambulatory Visit (INDEPENDENT_AMBULATORY_CARE_PROVIDER_SITE_OTHER): Payer: Medicare HMO | Admitting: Podiatry

## 2022-01-04 ENCOUNTER — Encounter: Payer: Self-pay | Admitting: Podiatry

## 2022-01-04 VITALS — BP 158/89 | HR 63

## 2022-01-04 DIAGNOSIS — M79674 Pain in right toe(s): Secondary | ICD-10-CM

## 2022-01-04 DIAGNOSIS — B351 Tinea unguium: Secondary | ICD-10-CM

## 2022-01-04 DIAGNOSIS — M79675 Pain in left toe(s): Secondary | ICD-10-CM

## 2022-01-04 DIAGNOSIS — E119 Type 2 diabetes mellitus without complications: Secondary | ICD-10-CM | POA: Diagnosis not present

## 2022-01-04 NOTE — Progress Notes (Signed)
This patient returns to my office for at risk foot care.  This patient requires this care by a professional since this patient will be at risk due to having diabetes.  This patient is unable to cut nails himself since the patient cannot reach his nails.These nails are painful walking and wearing shoes.  He presents to the office with his mother. This patient presents for at risk foot care today.  General Appearance  Alert, conversant and in no acute stress.  Vascular  Dorsalis pedis and posterior tibial  pulses are palpable  bilaterally.  Capillary return is within normal limits  bilaterally. Temperature is within normal limits  bilaterally.  Neurologic  Senn-Weinstein monofilament wire test within normal limits  bilaterally. Muscle power within normal limits bilaterally.  Nails Thick disfigured discolored nails with subungual debris  from hallux to fifth toes bilaterally. No evidence of bacterial infection or drainage bilaterally. Nail dystrophy both great toes.  Orthopedic  No limitations of motion  feet .  No crepitus or effusions noted.  No bony pathology or digital deformities noted.    Skin  normotropic skin with no porokeratosis noted bilaterally.  No signs of infections or ulcers noted.     Onychomycosis  Pain in right toes  Pain in left toes  Consent was obtained for treatment procedures.   Mechanical debridement of nails 1-5  bilaterally performed with a nail nipper.  Filed with dremel without incident.    Return office visit  3 months                    Told patient to return for periodic foot care and evaluation due to potential at risk complications.   Machai Desmith DPM   

## 2022-01-05 ENCOUNTER — Ambulatory Visit: Payer: Medicare HMO | Attending: Family Medicine | Admitting: Pharmacist

## 2022-01-05 VITALS — BP 177/98

## 2022-01-05 DIAGNOSIS — E1165 Type 2 diabetes mellitus with hyperglycemia: Secondary | ICD-10-CM | POA: Diagnosis not present

## 2022-01-05 LAB — POCT GLYCOSYLATED HEMOGLOBIN (HGB A1C): HbA1c, POC (controlled diabetic range): 6.2 % (ref 0.0–7.0)

## 2022-01-05 NOTE — Progress Notes (Signed)
S:     PCP: Juluis Mire  66 y.o. male who presents for diabetes evaluation, education, and management. PMH is significant for T2DM, HTN, HLD, and gout. Patient was referred and last seen by Primary Care Provider, NP Juluis Mire, on 11/18/2021. We saw him on 11/26/2021. At that visit, we changed his lisinopril to valsartan. In addition, we also increased his metformin dose.   Today, patient arrives with some hesitancy. Most of history is provided by his sister. She brings his medications in for review today.   Family/Social History:  -FHX: DM, heart disease, cancer  -Tobacco: never smoker  -Alcohol: none reported    Current diabetes medications include: glipizide 10 mg BID, Jardiance 25 mg daily, metformin 1000 mg BID Current hypertension medications include: amlodipine '10mg'$  once daily (not taking), HCTZ '25mg'$  once daily, carvedilol '25mg'$  BID, valsartan 320 mg daily  Current hyperlipidemia medications include: atorvastatin 80 mg  Patient reports adherence to taking all medications.  Insurance coverage: Aetna  Patient denies hypoglycemic events.  Reported home fasting blood sugars: not checking at home. His sister reports that he tells her not to check his blood sugar at home.   Patient reports nocturia (nighttime urination).  Patient reports neuropathy (nerve pain). Patient denies visual changes. Patient reports self foot exams.   Patient reported dietary habits:  -Sister is trying to increase the amount of non-starchy vegetables pt consumes -mainly fish -has not had fried foods in almost 60 days  -Trying to limit snack foods such as potato chips and popcorn but admits this is hard for him   Patient-reported exercise habits:  -None reported    O:  Lab Results  Component Value Date   HGBA1C 6.2 01/05/2022   Vitals:   01/05/22 1443  BP: (!) 177/98    Lipid Panel     Component Value Date/Time   CHOL 227 (H) 09/22/2021 1106   TRIG 126 09/22/2021 1106    HDL 39 (L) 09/22/2021 1106   CHOLHDL 5.8 (H) 09/22/2021 1106   LDLCALC 165 (H) 09/22/2021 1106    Clinical Atherosclerotic Cardiovascular Disease (ASCVD): No  The 10-year ASCVD risk score (Arnett DK, et al., 2019) is: 50.2%   Values used to calculate the score:     Age: 46 years     Sex: Male     Is Non-Hispanic African American: Yes     Diabetic: Yes     Tobacco smoker: No     Systolic Blood Pressure: 161 mmHg     Is BP treated: Yes     HDL Cholesterol: 39 mg/dL     Total Cholesterol: 227 mg/dL    A/P: Diabetes longstanding currently controlled with A1c today. Commended patient for this! He is able to verbalize appropriate hypoglycemia management plan. Medication adherence appears appropriate. Will switch metformin to the immediate release formulation as he is crushing up the medication and not getting the formulation benefit.  -Continued glipizide '10mg'$  once daily -Continued SGLT2-I Jardiance '25mg'$  once daily  -Continued metformin '1000mg'$  BID  -Extensively discussed pathophysiology of diabetes, recommended lifestyle interventions, dietary effects on blood sugar control.  -Counseled on s/sx of and management of hypoglycemia.  -Next A1c anticipated 04/2022.    Hypertension longstanding currently uncontrolled. Blood pressure goal of <130/80 mmHg. Medication adherence: he has not been taking amlodipine. He is adherent to carvedilol, HCTZ, and valsartan. -Continue valsartan '320mg'$  once daily -Resume amlodipine '10mg'$  once daily -Continued HCTZ '25mg'$  once daily -Continued carvedilol 25 mg BID  Written patient instructions provided.  Patient verbalized understanding of treatment plan.  Total time in face to face counseling 30 minutes.    Follow-up:  Pharmacist in 4-6 weeks.  Benard Halsted, PharmD, [place], [place] [phone]

## 2022-01-22 DIAGNOSIS — I10 Essential (primary) hypertension: Secondary | ICD-10-CM | POA: Diagnosis not present

## 2022-01-22 DIAGNOSIS — E785 Hyperlipidemia, unspecified: Secondary | ICD-10-CM | POA: Diagnosis not present

## 2022-01-22 DIAGNOSIS — E663 Overweight: Secondary | ICD-10-CM | POA: Diagnosis not present

## 2022-01-22 DIAGNOSIS — Z6828 Body mass index (BMI) 28.0-28.9, adult: Secondary | ICD-10-CM | POA: Diagnosis not present

## 2022-01-22 DIAGNOSIS — R69 Illness, unspecified: Secondary | ICD-10-CM | POA: Diagnosis not present

## 2022-01-22 DIAGNOSIS — E1169 Type 2 diabetes mellitus with other specified complication: Secondary | ICD-10-CM | POA: Diagnosis not present

## 2022-01-22 DIAGNOSIS — H548 Legal blindness, as defined in USA: Secondary | ICD-10-CM | POA: Diagnosis not present

## 2022-02-12 ENCOUNTER — Other Ambulatory Visit (INDEPENDENT_AMBULATORY_CARE_PROVIDER_SITE_OTHER): Payer: Self-pay | Admitting: Primary Care

## 2022-02-12 DIAGNOSIS — E1165 Type 2 diabetes mellitus with hyperglycemia: Secondary | ICD-10-CM

## 2022-02-12 NOTE — Telephone Encounter (Signed)
Requested Prescriptions  Pending Prescriptions Disp Refills   glipiZIDE (GLUCOTROL) 10 MG tablet [Pharmacy Med Name: GLIPIZIDE 10 MG TABLET] 180 tablet 0    Sig: TAKE 1 TABLET (10 MG TOTAL) BY MOUTH TWICE A DAY BEFORE A MEAL     Endocrinology:  Diabetes - Sulfonylureas Passed - 02/12/2022 12:12 AM      Passed - HBA1C is between 0 and 7.9 and within 180 days    HbA1c, POC (controlled diabetic range)  Date Value Ref Range Status  01/05/2022 6.2 0.0 - 7.0 % Final         Passed - Cr in normal range and within 360 days    Creatinine, Ser  Date Value Ref Range Status  11/26/2021 1.15 0.76 - 1.27 mg/dL Final         Passed - Valid encounter within last 6 months    Recent Outpatient Visits           1 month ago Uncontrolled type 2 diabetes mellitus with hyperglycemia [facility])   [place], Cass L, RPH-CPP   2 months ago Uncontrolled type 2 diabetes mellitus with hyperglycemia [facility])   [place], Annie Main L, RPH-CPP   2 months ago Essential hypertension   [place], Michelle P, NP   4 months ago Uncontrolled type 2 diabetes mellitus with hyperglycemia [facility])   [place], Annie Main L, RPH-CPP   5 months ago Uncontrolled type 2 diabetes mellitus with hyperglycemia ([place])   [place], Michelle P, NP       Future Appointments             In 4 days Tresa Endo, [place]   In 1 week Oletta Lamas, Milford Cage, NP [facility]             JARDIANCE 25 MG TABS tablet [Pharmacy Med Name: JARDIANCE 25 MG TABLET] 90 tablet 0    Sig: TAKE 1 TABLET BY MOUTH DAILY BEFORE BREAKFAST.     Endocrinology:  Diabetes - SGLT2 Inhibitors Passed - 02/12/2022 12:12 AM      Passed - Cr in normal range and within 360 days    Creatinine, Ser  Date  Value Ref Range Status  11/26/2021 1.15 0.76 - 1.27 mg/dL Final         Passed - HBA1C is between 0 and 7.9 and within 180 days    HbA1c, POC (controlled diabetic range)  Date Value Ref Range Status  01/05/2022 6.2 0.0 - 7.0 % Final         Passed - eGFR in normal range and within 360 days    GFR calc Af Amer  Date Value Ref Range Status  08/30/2018 >60 >60 mL/min Final   GFR calc non Af Amer  Date Value Ref Range Status  08/30/2018 >60 >60 mL/min Final   eGFR  Date Value Ref Range Status  11/26/2021 71 >59 mL/min/1.73 Final         Passed - Valid encounter within last 6 months    Recent Outpatient Visits           1 month ago Uncontrolled type 2 diabetes mellitus with hyperglycemia [facility])   [place], Annie Main L, RPH-CPP   2 months ago Uncontrolled type 2 diabetes mellitus with hyperglycemia (  [facility])   [place], Jarome Matin, RPH-CPP   2 months ago Essential hypertension   [place], Michelle P, NP   4 months ago Uncontrolled type 2 diabetes mellitus with hyperglycemia [facility])   [place], Jarome Matin, RPH-CPP   5 months ago Uncontrolled type 2 diabetes mellitus with hyperglycemia [facility])   [place], [place], NP       Future Appointments             In 4 days Tresa Endo, [place]   In 1 week Kerin Perna, NP [place]

## 2022-02-16 ENCOUNTER — Ambulatory Visit: Payer: Medicare HMO | Admitting: Pharmacist

## 2022-02-22 ENCOUNTER — Ambulatory Visit (INDEPENDENT_AMBULATORY_CARE_PROVIDER_SITE_OTHER): Payer: Medicare HMO | Admitting: Primary Care

## 2022-03-17 ENCOUNTER — Ambulatory Visit (HOSPITAL_COMMUNITY)
Admission: EM | Admit: 2022-03-17 | Discharge: 2022-03-17 | Disposition: A | Discharge status: Home / Self Care | Payer: Medicare HMO | Attending: Family | Admitting: Family

## 2022-03-17 DIAGNOSIS — F4320 Adjustment disorder, unspecified: Secondary | ICD-10-CM | POA: Diagnosis present

## 2022-03-17 NOTE — Discharge Instructions (Addendum)
Patient is instructed prior to discharge to:  Take all medications as prescribed by his/her mental healthcare provider. Report any adverse effects and or reactions from the medicines to his/her outpatient provider promptly. Keep all scheduled appointments, to ensure that you are getting refills on time and to avoid any interruption in your medication.  If you are unable to keep an appointment call to reschedule.  Be sure to follow-up with resources and follow-up appointments provided.  Patient has been instructed & cautioned: To not engage in alcohol and or illegal drug use while on prescription medicines. In the event of worsening symptoms, patient is instructed to call the crisis hotline, 911 and or go to the nearest ED for appropriate evaluation and treatment of symptoms. To follow-up with his/her primary care provider for your other medical issues, concerns and or health care needs.  Information: -National Suicide Prevention Lifeline 1-800-SUICIDE or 8586438719.  -988 offers 24/7 access to trained crisis counselors who can help people experiencing mental health-related distress. People can call or text 988 or chat 988lifeline.org for themselves or if they are worried about a loved one who may need crisis support.     Outpatient Therapy for Medicare Recipients:  [place] [address] [place], [place], [address] [phone] phone  [place] [address] [place], [place], [address] [phone] phone ([address] [place], [place], [place], [place], [employer], [place], [place], [place], [place], [place], [place], [name], [place], [place], [employer], [place])  [facility] [address], [place], [place], [address] [phone] phone  [place] [address] [place], [place], [address] [phone]  phone  [place] [address] [place], [place], [address] [phone] phone [phone] fax  [facility], [place] [address], [place], [address] [phone] phone  [address] [place], [place], [address] [phone] phone [phone] fax  [place] [address] [place], [place], [address] [phone] phone  Jinny Blossom [address] [place], [place], [address] [phone] phone  [facility] [place] [employer]. [place], [place], [address] [phone] phone [phone] fax

## 2022-03-17 NOTE — ED Provider Notes (Signed)
Behavioral Health Urgent Care Medical Screening Exam  Patient Name: Robert Cole MRN: [id number] Date of Evaluation: 03/17/22 Chief Complaint:   Diagnosis:  Final diagnoses:  Adjustment disorder, unspecified type    History of Present illness: Robert Cole is a 67 y.o. male. Patient presents voluntarily to [facility] behavioral health for walk-in assessment.  Patient is accompanied by his sister, Deverne Bazer, who does not remain present during assessment.   Patient is assessed, face-to-face, by nurse practitioner. He is seated in assessment area, no acute distress. Consulted with provider, Dr.  Dwyane Dee, and chart reviewed on 03/17/2022. He  is alert and oriented, pleasant and cooperative during assessment.   Patient states "I need somebody to talk to, I had friends, someone to spend time with and eat lunch with but one of them moved away."  Patient reports he is interested in getting into a pottery class "to learn how to make something."  Chronic stressors include "I live with my sister and she wants to run everybody's life, she will let me spend my money the way that I want to."  Darl is not linked with outpatient psychiatry currently.  He would like to be linked with individual counseling moving forward.  Denies personal mental health history, currently no medications to address mood.  He denies history of inpatient psychiatric hospitalization.  No family mental health history reported.  Patient  presents with euthymic mood, congruent affect. He  denies suicidal and homicidal ideations. Denies history of suicide attempts, denies history of non suicidal  self-harm behavior.  Patient easily  contracts verbally for safety with this Probation officer.    Patient has normal speech and behavior.  He  denies auditory and visual hallucinations.  Patient is able to converse coherently with goal-directed thoughts and no distractibility or preoccupation.  Denies symptoms of paranoia.  Objectively there is no  evidence of psychosis/mania or delusional thinking.  Robert Cole resides in [place] with his sister and nephew.  He denies access to weapons.  He receives SSI.  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  Patient endorses average sleep and appetite.  Patient denies alcohol substance use.  Patient offered support and encouragement.  He gives verbal consent to speak with his sister, Jetli Laubscher.  Patient is linked with primary care.  He is compliant with medications as prescribed.  Patient's sister assist with administration of medications.  He did not take medications prior to arrival today.  Blood pressure measures 152/102.  Spoke with patient's sister who reports patient "he needs a social life."  Per patient sister 1 friend moved away and 1 friend died within the last 2 years.  Patient's sister functions as primary caregiver and payee for patient.  Patient sister believes patient would benefit from more opportunities to "get out into the community and make new friends."  She is actively working with patient's insurer to seek additional case management resources.   Patient and family are educated and verbalize understanding of mental health resources and other crisis services in the community. They are instructed to call 911 and present to the nearest emergency room should patient experience any suicidal/homicidal ideation, auditory/visual/hallucinations, or detrimental worsening of mental health condition.      [place] ED from 03/17/2022 in [place] No Risk       Psychiatric Specialty Exam  Presentation  General Appearance:Appropriate for Environment; Casual  Eye Contact:Fair  Speech:Clear and Coherent; Normal Rate  Speech Volume:Normal  Handedness:Right   Mood and Affect  Mood: Euthymic  Affect: Appropriate; Congruent   Thought Process  Thought Processes: Coherent;  Goal Directed; Linear  Descriptions of Associations:Intact  Orientation:Full (Time, Place and Person)  Thought Content:Logical; WDL    Hallucinations:None  Ideas of Reference:None  Suicidal Thoughts:No  Homicidal Thoughts:No   Sensorium  Memory: Immediate Fair; Recent Fair  Judgment: Intact  Insight: Present   Executive Functions  Concentration: Good  Attention Span: Good  Recall: Good  Fund of Knowledge: Fair  Language: Fair   Psychomotor Activity  Psychomotor Activity: Normal   Assets  Assets: Communication Skills; Desire for Improvement; Financial Resources/Insurance; Housing; Intimacy; Physical Health; Resilience; Social Support   Sleep  Sleep: Fair  Number of hours: No data recorded  Physical Exam: Physical Exam Vitals and nursing note reviewed.  Constitutional:      Appearance: Normal appearance. He is well-developed and normal weight.  HENT:     Head: Normocephalic and atraumatic.     Nose: Nose normal.  Cardiovascular:     Rate and Rhythm: Normal rate.  Pulmonary:     Effort: Pulmonary effort is normal.  Musculoskeletal:        General: Normal range of motion.     Cervical back: Normal range of motion.  Skin:    General: Skin is warm and dry.  Neurological:     Mental Status: He is alert and oriented to person, place, and time.  Psychiatric:        Attention and Perception: Attention and perception normal.        Mood and Affect: Mood and affect normal.        Speech: Speech normal.        Behavior: Behavior normal. Behavior is cooperative.        Thought Content: Thought content normal.        Cognition and Memory: Cognition and memory normal.    Review of Systems  Constitutional: Negative.   HENT: Negative.    Eyes: Negative.   Respiratory: Negative.    Cardiovascular: Negative.   Gastrointestinal: Negative.   Genitourinary: Negative.   Musculoskeletal: Negative.   Skin: Negative.   Neurological: Negative.    Psychiatric/Behavioral: Negative.     Blood pressure (!) 152/102, pulse 78, temperature 98.1 F (36.7 C), temperature source Oral, resp. rate 19, SpO2 99 %. There is no height or weight on file to calculate BMI.  Musculoskeletal: Strength & Muscle Tone: within normal limits Gait & Station: normal Patient leans: N/A   [place] MSE Discharge Disposition for Follow up and Recommendations: Based on my evaluation the patient does not appear to have an emergency medical condition and can be discharged with resources and follow up care in outpatient services for Individual Therapy and Group Therapy Follow-up with outpatient psychiatry, resources provided.  Lucky Rathke, FNP 03/17/2022, 12:11 PM

## 2022-03-17 NOTE — Progress Notes (Signed)
   03/17/22 1103  [place] (Walk-ins at [facility] only)  How Did You Hear About [place]? Self  What Is the Reason for Your Visit/Call Today? Patient states that he is presenting to the [facility] today seeking help for his depression.  Patient states that he is not happy and that he needs to talk to someone about some things that have happened.  Patient states that he has been depressed since last year.  He states that he lost two of his friends.  he states that one died, but he is not sure where the other one is.  He states that they were good friends and he states that he misses them. Patient states that he has been isolating and keeping to himself.  He states that he feels lonely.  Patient states that his sister is in his business and he states that he does not like it.  Patient states that he lives with her and he states that he gets stressed at home because she will not leave him alone. He states that she tells him how to run his life. Patient states that he has no other place to stay.  Patient denies SI/HI/Psychosis. Patient denies any drug and/or alcohol use.  Patient states he is sleeping six hours per night.  He states that his appetite is good. Patient denies any history of trauma or abuse in his life.  Patient is routine, his issues are more related to psycho-social stressors.  How Long Has This Been Causing You Problems? > than 6 months  Have You Recently Had Any Thoughts About Hurting Yourself? No  Are You Planning to Commit Suicide/Harm Yourself At This time? No  Have you Recently Had Thoughts About [place]? No  Are You Planning To Harm Someone At This Time? No  Are you currently experiencing any auditory, visual or other hallucinations? No  Have You Used Any Alcohol or Drugs in the Past 24 Hours? No  Do you have any current medical co-morbidities that require immediate attention? No  Clinician description of patient physical appearance/behavior: casually dressed, clean and neat   What Do You Feel Would Help You the Most Today? Treatment for Depression or other mood problem;Housing Assistance  If access to [facility] Urgent Care was not available, would you have sought care in the Emergency Department? No  Determination of Need Routine (7 days)  Options For Referral [facility] Urgent Care;Outpatient Therapy

## 2022-04-05 ENCOUNTER — Ambulatory Visit (INDEPENDENT_AMBULATORY_CARE_PROVIDER_SITE_OTHER): Payer: Medicare HMO | Admitting: Podiatry

## 2022-04-05 ENCOUNTER — Encounter: Payer: Self-pay | Admitting: Podiatry

## 2022-04-05 VITALS — BP 198/99 | HR 79

## 2022-04-05 DIAGNOSIS — B351 Tinea unguium: Secondary | ICD-10-CM | POA: Diagnosis not present

## 2022-04-05 DIAGNOSIS — E119 Type 2 diabetes mellitus without complications: Secondary | ICD-10-CM

## 2022-04-05 DIAGNOSIS — M79674 Pain in right toe(s): Secondary | ICD-10-CM

## 2022-04-05 DIAGNOSIS — M79675 Pain in left toe(s): Secondary | ICD-10-CM | POA: Diagnosis not present

## 2022-04-05 NOTE — Progress Notes (Signed)
This patient returns to my office for at risk foot care.  This patient requires this care by a professional since this patient will be at risk due to having diabetes.  This patient is unable to cut nails himself since the patient cannot reach his nails.These nails are painful walking and wearing shoes.  He presents to the office with his mother. This patient presents for at risk foot care today.  General Appearance  Alert, conversant and in no acute stress.  Vascular  Dorsalis pedis and posterior tibial  pulses are palpable  bilaterally.  Capillary return is within normal limits  bilaterally. Temperature is within normal limits  bilaterally.  Neurologic  Senn-Weinstein monofilament wire test within normal limits  bilaterally. Muscle power within normal limits bilaterally.  Nails Thick disfigured discolored nails with subungual debris  from hallux to fifth toes bilaterally. No evidence of bacterial infection or drainage bilaterally. Nail dystrophy both great toes.  Orthopedic  No limitations of motion  feet .  No crepitus or effusions noted.  No bony pathology or digital deformities noted.    Skin  normotropic skin with no porokeratosis noted bilaterally.  No signs of infections or ulcers noted.     Onychomycosis  Pain in right toes  Pain in left toes  Consent was obtained for treatment procedures.   Mechanical debridement of nails 1-5  bilaterally performed with a nail nipper.  Filed with dremel without incident.    Return office visit  3 months                    Told patient to return for periodic foot care and evaluation due to potential at risk complications.   Gardiner Barefoot DPM

## 2022-04-12 ENCOUNTER — Other Ambulatory Visit: Payer: Self-pay | Admitting: Family Medicine

## 2022-04-12 NOTE — Telephone Encounter (Signed)
Requested Prescriptions  Pending Prescriptions Disp Refills   atorvastatin (LIPITOR) 80 MG tablet [Pharmacy Med Name: ATORVASTATIN 80 MG TABLET] 90 tablet 0    Sig: TAKE 1 TABLET BY MOUTH EVERY DAY     Cardiovascular:  Antilipid - Statins Failed - 04/12/2022 12:13 AM      Failed - Lipid Panel in normal range within the last 12 months    Cholesterol, Total  Date Value Ref Range Status  09/22/2021 227 (H) 100 - 199 mg/dL Final   LDL Chol Calc (NIH)  Date Value Ref Range Status  09/22/2021 165 (H) 0 - 99 mg/dL Final   HDL  Date Value Ref Range Status  09/22/2021 39 (L) >39 mg/dL Final   Triglycerides  Date Value Ref Range Status  09/22/2021 126 0 - 149 mg/dL Final         Passed - Patient is not pregnant      Passed - Valid encounter within last 12 months    Recent Outpatient Visits           3 months ago Uncontrolled type 2 diabetes mellitus with hyperglycemia [facility])   [place], Grandin L, RPH-CPP   4 months ago Uncontrolled type 2 diabetes mellitus with hyperglycemia [facility])   [place], Jarome Matin, RPH-CPP   4 months ago Essential hypertension   [place], Michelle P, NP   6 months ago Uncontrolled type 2 diabetes mellitus with hyperglycemia [facility])   [place], [place] L, RPH-CPP   7 months ago Uncontrolled type 2 diabetes mellitus with hyperglycemia [facility])   [place] Kerin Perna, NP

## 2022-05-10 ENCOUNTER — Other Ambulatory Visit (INDEPENDENT_AMBULATORY_CARE_PROVIDER_SITE_OTHER): Payer: Self-pay | Admitting: Primary Care

## 2022-05-10 DIAGNOSIS — E1165 Type 2 diabetes mellitus with hyperglycemia: Secondary | ICD-10-CM

## 2022-05-10 DIAGNOSIS — I10 Essential (primary) hypertension: Secondary | ICD-10-CM

## 2022-05-11 NOTE — Telephone Encounter (Signed)
Requested Prescriptions  Pending Prescriptions Disp Refills   JARDIANCE 25 MG TABS tablet [Pharmacy Med Name: JARDIANCE 25 MG TABLET] 90 tablet 0    Sig: TAKE 1 TABLET BY MOUTH EVERY DAY BEFORE BREAKFAST     Endocrinology:  Diabetes - SGLT2 Inhibitors Passed - 05/10/2022 12:19 AM      Passed - Cr in normal range and within 360 days    Creatinine, Ser  Date Value Ref Range Status  11/26/2021 1.15 0.76 - 1.27 mg/dL Final         Passed - HBA1C is between 0 and 7.9 and within 180 days    HbA1c, POC (controlled diabetic range)  Date Value Ref Range Status  01/05/2022 6.2 0.0 - 7.0 % Final         Passed - eGFR in normal range and within 360 days    GFR calc Af Amer  Date Value Ref Range Status  08/30/2018 >60 >60 mL/min Final   GFR calc non Af Amer  Date Value Ref Range Status  08/30/2018 >60 >60 mL/min Final   eGFR  Date Value Ref Range Status  11/26/2021 71 >59 mL/min/1.73 Final         Passed - Valid encounter within last 6 months    Recent Outpatient Visits           4 months ago Uncontrolled type 2 diabetes mellitus with hyperglycemia (HCC)   [place] [facility] [place], Cornelius Moras, RPH-CPP   5 months ago Uncontrolled type 2 diabetes mellitus with hyperglycemia [facility])   [place] [facility] [place], Cornelius Moras, RPH-CPP   5 months ago Essential hypertension   [place] [facility] Grayce Sessions, NP   7 months ago Uncontrolled type 2 diabetes mellitus with hyperglycemia [facility])   [place] [facility] [place], [place] L, RPH-CPP   8 months ago Uncontrolled type 2 diabetes mellitus with hyperglycemia (HCC)   [place] [facility] Grayce Sessions, NP               glipiZIDE (GLUCOTROL) 10 MG tablet [Pharmacy Med Name: GLIPIZIDE 10 MG TABLET] 180 tablet 0    Sig: TAKE 1 TABLET (10 MG TOTAL) BY MOUTH TWICE A DAY BEFORE A MEAL      Endocrinology:  Diabetes - Sulfonylureas Passed - 05/10/2022 12:19 AM      Passed - HBA1C is between 0 and 7.9 and within 180 days    HbA1c, POC (controlled diabetic range)  Date Value Ref Range Status  01/05/2022 6.2 0.0 - 7.0 % Final         Passed - Cr in normal range and within 360 days    Creatinine, Ser  Date Value Ref Range Status  11/26/2021 1.15 0.76 - 1.27 mg/dL Final         Passed - Valid encounter within last 6 months    Recent Outpatient Visits           4 months ago Uncontrolled type 2 diabetes mellitus with hyperglycemia [facility] [place])   [place] [place], [place] L, RPH-CPP   5 months ago Uncontrolled type 2 diabetes mellitus with hyperglycemia [facility])   [place] [facility] [place], Cornelius Moras, RPH-CPP   5 months ago Essential hypertension   [place] [facility] Grayce Sessions, NP   7 months ago Uncontrolled type 2  diabetes mellitus with hyperglycemia [facility])   [place] [facility] [place], Jeannett Senior L, RPH-CPP   8 months ago Uncontrolled type 2 diabetes mellitus with hyperglycemia (HCC)   [place] [facility] Grayce Sessions, NP               amLODipine (NORVASC) 10 MG tablet [Pharmacy Med Name: AMLODIPINE BESYLATE 10 MG TAB] 90 tablet 1    Sig: TAKE 1 TABLET BY MOUTH EVERY DAY     Cardiovascular: Calcium Channel Blockers 2 Failed - 05/10/2022 12:19 AM      Failed - Last BP in normal range    BP Readings from Last 1 Encounters:  04/05/22 (!) 198/99         Passed - Last Heart Rate in normal range    Pulse Readings from Last 1 Encounters:  04/05/22 79         Passed - Valid encounter within last 6 months    Recent Outpatient Visits           4 months ago Uncontrolled type 2 diabetes mellitus with hyperglycemia [facility])   [place] [facility] [place], [place] L, RPH-CPP   5 months ago  Uncontrolled type 2 diabetes mellitus with hyperglycemia [facility])   [place] [facility] [place], Cornelius Moras, RPH-CPP   5 months ago Essential hypertension   [place] [facility] Grayce Sessions, NP   7 months ago Uncontrolled type 2 diabetes mellitus with hyperglycemia [facility])   [place] [facility] [place], [place] L, RPH-CPP   8 months ago Uncontrolled type 2 diabetes mellitus with hyperglycemia [facility])    [facility] Grayce Sessions, NP

## 2022-05-19 ENCOUNTER — Other Ambulatory Visit: Payer: Self-pay | Admitting: Family Medicine

## 2022-05-19 DIAGNOSIS — I10 Essential (primary) hypertension: Secondary | ICD-10-CM

## 2022-06-10 ENCOUNTER — Telehealth (INDEPENDENT_AMBULATORY_CARE_PROVIDER_SITE_OTHER): Payer: Self-pay | Admitting: Primary Care

## 2022-06-10 NOTE — Telephone Encounter (Signed)
Contacted Robert Cole to schedule their annual wellness visit. Patient declined to schedule AWV at this time. Per sister no longer a patient at [facility]  Thank you,  [name],  AMB Clinical Support [facility] AWV Program Direct Dial ??[phone]

## 2022-06-26 ENCOUNTER — Other Ambulatory Visit: Payer: Self-pay | Admitting: Family Medicine

## 2022-06-29 NOTE — Telephone Encounter (Signed)
Requested medication (s) are due for refill today:   Yes  Requested medication (s) are on the active medication list:   Yes  Future visit scheduled:   No   Last ordered: 09/23/2021 #90, 1 refill  Unable to refill per protocol because labs are due.   Requested Prescriptions  Pending Prescriptions Disp Refills   hydrochlorothiazide (HYDRODIURIL) 25 MG tablet [Pharmacy Med Name: HYDROCHLOROTHIAZIDE 25 MG TAB] 90 tablet 1    Sig: Take 1 tablet (25 mg total) by mouth daily.     Cardiovascular: Diuretics - Thiazide Failed - 06/26/2022 12:08 AM      Failed - Cr in normal range and within 180 days    Creatinine, Ser  Date Value Ref Range Status  11/26/2021 1.15 0.76 - 1.27 mg/dL Final         Failed - K in normal range and within 180 days    Potassium  Date Value Ref Range Status  11/26/2021 4.2 3.5 - 5.2 mmol/L Final         Failed - Na in normal range and within 180 days    Sodium  Date Value Ref Range Status  11/26/2021 139 134 - 144 mmol/L Final         Failed - Last BP in normal range    BP Readings from Last 1 Encounters:  04/05/22 (!) 198/99         Passed - Valid encounter within last 6 months    Recent Outpatient Visits           5 months ago Uncontrolled type 2 diabetes mellitus with hyperglycemia [facility])   [place] [facility] [place], Jeannett Senior L, RPH-CPP   7 months ago Uncontrolled type 2 diabetes mellitus with hyperglycemia [facility])   [place] [facility] [place], Cornelius Moras, RPH-CPP   7 months ago Essential hypertension   [place] [facility] Grayce Sessions, NP   9 months ago Uncontrolled type 2 diabetes mellitus with hyperglycemia [facility])   [place] [facility] [place], [place] L, RPH-CPP   10 months ago Uncontrolled type 2 diabetes mellitus with hyperglycemia [facility])   [place] [facility] Grayce Sessions, NP

## 2022-07-07 ENCOUNTER — Ambulatory Visit: Payer: Medicare HMO | Admitting: Podiatry

## 2022-07-07 ENCOUNTER — Other Ambulatory Visit: Payer: Self-pay | Admitting: Family Medicine

## 2022-07-12 ENCOUNTER — Ambulatory Visit (INDEPENDENT_AMBULATORY_CARE_PROVIDER_SITE_OTHER): Payer: Medicare HMO | Admitting: Podiatry

## 2022-07-12 ENCOUNTER — Encounter: Payer: Self-pay | Admitting: Podiatry

## 2022-07-12 DIAGNOSIS — E119 Type 2 diabetes mellitus without complications: Secondary | ICD-10-CM

## 2022-07-12 DIAGNOSIS — M79675 Pain in left toe(s): Secondary | ICD-10-CM | POA: Diagnosis not present

## 2022-07-12 DIAGNOSIS — M79674 Pain in right toe(s): Secondary | ICD-10-CM | POA: Diagnosis not present

## 2022-07-12 DIAGNOSIS — B351 Tinea unguium: Secondary | ICD-10-CM | POA: Diagnosis not present

## 2022-07-12 NOTE — Progress Notes (Signed)
This patient returns to my office for at risk foot care.  This patient requires this care by a professional since this patient will be at risk due to having diabetes.  This patient is unable to cut nails himself since the patient cannot reach his nails.These nails are painful walking and wearing shoes.  He presents to the office with his mother. This patient presents for at risk foot care today.  General Appearance  Alert, conversant and in no acute stress.  Vascular  Dorsalis pedis and posterior tibial  pulses are palpable  bilaterally.  Capillary return is within normal limits  bilaterally. Temperature is within normal limits  bilaterally.  Neurologic  Senn-Weinstein monofilament wire test within normal limits  bilaterally. Muscle power within normal limits bilaterally.  Nails Thick disfigured discolored nails with subungual debris  from hallux to fifth toes bilaterally. No evidence of bacterial infection or drainage bilaterally. Nail dystrophy both great toes.  Orthopedic  No limitations of motion  feet .  No crepitus or effusions noted.  No bony pathology or digital deformities noted.    Skin  normotropic skin with no porokeratosis noted bilaterally.  No signs of infections or ulcers noted.   Dry scaly skin forefoot plantar  B/L.  Onychomycosis  Pain in right toes  Pain in left toes  Consent was obtained for treatment procedures.   Mechanical debridement of nails 1-5  bilaterally performed with a nail nipper.  Filed with dremel without incident.    Return office visit  3 months                    Told patient to return for periodic foot care and evaluation due to potential at risk complications.   Helane Gunther DPM

## 2022-08-07 ENCOUNTER — Other Ambulatory Visit (INDEPENDENT_AMBULATORY_CARE_PROVIDER_SITE_OTHER): Payer: Self-pay | Admitting: Primary Care

## 2022-08-07 DIAGNOSIS — E1165 Type 2 diabetes mellitus with hyperglycemia: Secondary | ICD-10-CM

## 2022-08-09 NOTE — Telephone Encounter (Signed)
Requested medication (s) are due for refill today: Yes  Requested medication (s) are on the active medication list: Yes  Last refill:  05/11/22  Future visit scheduled: No  Notes to clinic:  Protocol indicates lab work needed. Appointment needed.    Requested Prescriptions  Pending Prescriptions Disp Refills   glipiZIDE (GLUCOTROL) 10 MG tablet [Pharmacy Med Name: GLIPIZIDE 10 MG TABLET] 180 tablet 0    Sig: TAKE 1 TABLET (10 MG TOTAL) BY MOUTH TWICE A DAY BEFORE A MEAL     Endocrinology:  Diabetes - Sulfonylureas Failed - 08/07/2022 12:18 AM      Failed - HBA1C is between 0 and 7.9 and within 180 days    HbA1c, POC (controlled diabetic range)  Date Value Ref Range Status  01/05/2022 6.2 0.0 - 7.0 % Final         Failed - Valid encounter within last 6 months    Recent Outpatient Visits           7 months ago Uncontrolled type 2 diabetes mellitus with hyperglycemia [facility])   [place] [facility] [place], [place] L, RPH-CPP   8 months ago Uncontrolled type 2 diabetes mellitus with hyperglycemia [facility])   [place] [facility] [place], Cornelius Moras, RPH-CPP   8 months ago Essential hypertension   [place] [facility] Grayce Sessions, NP   10 months ago Uncontrolled type 2 diabetes mellitus with hyperglycemia [facility])   [place] [facility] [place], Jeannett Senior L, RPH-CPP   11 months ago Uncontrolled type 2 diabetes mellitus with hyperglycemia [facility])   [place] [facility] Grayce Sessions, NP              Passed - Cr in normal range and within 360 days    Creatinine, Ser  Date Value Ref Range Status  11/26/2021 1.15 0.76 - 1.27 mg/dL Final

## 2022-08-15 ENCOUNTER — Other Ambulatory Visit: Payer: Self-pay | Admitting: Family Medicine

## 2022-08-15 DIAGNOSIS — I10 Essential (primary) hypertension: Secondary | ICD-10-CM

## 2022-08-16 NOTE — Telephone Encounter (Signed)
Requested Prescriptions  Pending Prescriptions Disp Refills   valsartan (DIOVAN) 320 MG tablet [Pharmacy Med Name: VALSARTAN 320 MG TABLET] 90 tablet 0    Sig: TAKE 1 TABLET BY MOUTH EVERY DAY     Cardiovascular:  Angiotensin Receptor Blockers Failed - 08/15/2022 12:38 AM      Failed - Cr in normal range and within 180 days    Creatinine, Ser  Date Value Ref Range Status  11/26/2021 1.15 0.76 - 1.27 mg/dL Final         Failed - K in normal range and within 180 days    Potassium  Date Value Ref Range Status  11/26/2021 4.2 3.5 - 5.2 mmol/L Final         Failed - Last BP in normal range    BP Readings from Last 1 Encounters:  04/05/22 (!) 198/99         Failed - Valid encounter within last 6 months    Recent Outpatient Visits           7 months ago Uncontrolled type 2 diabetes mellitus with hyperglycemia (HCC)   [place] [facility] [place], Cornelius Moras, RPH-CPP   8 months ago Uncontrolled type 2 diabetes mellitus with hyperglycemia [facility])   [place] [facility] [place], Cornelius Moras, RPH-CPP   9 months ago Essential hypertension   [place] [facility] Grayce Sessions, NP   10 months ago Uncontrolled type 2 diabetes mellitus with hyperglycemia [facility])   [place] [facility] [place], Jeannett Senior L, RPH-CPP   12 months ago Uncontrolled type 2 diabetes mellitus with hyperglycemia (HCC)   [place] [facility] Grayce Sessions, NP              Passed - Patient is not pregnant       metFORMIN (GLUCOPHAGE) 1000 MG tablet [Pharmacy Med Name: METFORMIN HCL 1,000 MG TABLET] 180 tablet 0    Sig: TAKE 1 TABLET (1,000 MG TOTAL) BY MOUTH TWICE A DAY WITH FOOD     Endocrinology:  Diabetes - Biguanides Failed - 08/15/2022 12:38 AM      Failed - HBA1C is between 0 and 7.9 and within 180 days    HbA1c, POC (controlled diabetic range)  Date Value Ref Range Status   01/05/2022 6.2 0.0 - 7.0 % Final         Failed - B12 Level in normal range and within 720 days    No results found for: "VITAMINB12"       Failed - Valid encounter within last 6 months    Recent Outpatient Visits           7 months ago Uncontrolled type 2 diabetes mellitus with hyperglycemia [facility])   [place] [facility] [place], [place] L, RPH-CPP   8 months ago Uncontrolled type 2 diabetes mellitus with hyperglycemia [facility])   [place] [facility] [place], Cornelius Moras, RPH-CPP   9 months ago Essential hypertension   [place] [facility] Grayce Sessions, NP   10 months ago Uncontrolled type 2 diabetes mellitus with hyperglycemia [facility])   [place] [facility] [place], [place] L, RPH-CPP   12 months ago Uncontrolled type 2 diabetes mellitus with hyperglycemia [facility])    [facility] Grayce Sessions, NP  Failed - CBC within normal limits and completed in the last 12 months    WBC  Date Value Ref Range Status  08/30/2013 12.0 (H) 4.0 - 10.5 K/uL Final   RBC  Date Value Ref Range Status  08/30/2013 4.17 (L) 4.22 - 5.81 MIL/uL Final   Hemoglobin  Date Value Ref Range Status  08/30/2013 11.6 (L) 13.0 - 17.0 g/dL Final   HCT  Date Value Ref Range Status  08/30/2013 35.6 (L) 39.0 - 52.0 % Final   MCHC  Date Value Ref Range Status  08/30/2013 32.6 30.0 - 36.0 g/dL Final   [facility]  Date Value Ref Range Status  08/30/2013 27.8 26.0 - 34.0 pg Final   MCV  Date Value Ref Range Status  08/30/2013 85.4 78.0 - 100.0 fL Final   No results found for: "PLTCOUNTKUC", "LABPLAT", "POCPLA" RDW  Date Value Ref Range Status  08/30/2013 14.1 11.5 - 15.5 % Final         Passed - Cr in normal range and within 360 days    Creatinine, Ser  Date Value Ref Range Status  11/26/2021 1.15 0.76 - 1.27 mg/dL Final         Passed - eGFR in  normal range and within 360 days    GFR calc Af Amer  Date Value Ref Range Status  08/30/2018 >60 >60 mL/min Final   GFR calc non Af Amer  Date Value Ref Range Status  08/30/2018 >60 >60 mL/min Final   eGFR  Date Value Ref Range Status  11/26/2021 71 >59 mL/min/1.73 Final

## 2022-08-16 NOTE — Telephone Encounter (Signed)
Spoke to patient's sister Levis Nazir [facility]). Albin Felling stated that the patient is no longer a patient. She stated his new PCP is Dr. Clelia Croft at [facility]. Advised to request medication refills from Dr. Clelia Croft. Carla verbalized understanding.

## 2022-10-12 ENCOUNTER — Ambulatory Visit: Payer: Medicare HMO | Admitting: Podiatry

## 2022-11-04 ENCOUNTER — Other Ambulatory Visit (INDEPENDENT_AMBULATORY_CARE_PROVIDER_SITE_OTHER): Payer: Self-pay | Admitting: Primary Care

## 2022-11-04 DIAGNOSIS — I10 Essential (primary) hypertension: Secondary | ICD-10-CM

## 2022-11-04 NOTE — Telephone Encounter (Signed)
As of 06/10/22 pt no longer a pt at the practice. Requested Prescriptions  Pending Prescriptions Disp Refills   amLODipine (NORVASC) 10 MG tablet [Pharmacy Med Name: AMLODIPINE BESYLATE 10 MG TAB] 90 tablet 1    Sig: TAKE 1 TABLET BY MOUTH EVERY DAY     Cardiovascular: Calcium Channel Blockers 2 Failed - 11/04/2022 12:19 AM      Failed - Last BP in normal range    BP Readings from Last 1 Encounters:  04/05/22 (!) 198/99         Failed - Valid encounter within last 6 months    Recent Outpatient Visits           10 months ago Uncontrolled type 2 diabetes mellitus with hyperglycemia [facility])   [place] [facility] [place], [place] L, RPH-CPP   11 months ago Uncontrolled type 2 diabetes mellitus with hyperglycemia [facility])   [place] [facility] [place], Cornelius Moras, RPH-CPP   11 months ago Essential hypertension   [place] [facility] Grayce Sessions, NP   1 year ago Uncontrolled type 2 diabetes mellitus with hyperglycemia [facility])   [place] [facility] [place], [place] L, RPH-CPP   1 year ago Uncontrolled type 2 diabetes mellitus with hyperglycemia [facility])   [place] [facility] Grayce Sessions, NP              Passed - Last Heart Rate in normal range    Pulse Readings from Last 1 Encounters:  04/05/22 79

## 2022-12-27 ENCOUNTER — Other Ambulatory Visit (INDEPENDENT_AMBULATORY_CARE_PROVIDER_SITE_OTHER): Payer: Self-pay | Admitting: Primary Care

## 2022-12-27 DIAGNOSIS — I10 Essential (primary) hypertension: Secondary | ICD-10-CM

## 2023-10-20 ENCOUNTER — Encounter: Payer: Self-pay | Admitting: Podiatry

## 2023-10-20 ENCOUNTER — Ambulatory Visit (INDEPENDENT_AMBULATORY_CARE_PROVIDER_SITE_OTHER): Admitting: Podiatry

## 2023-10-20 DIAGNOSIS — E119 Type 2 diabetes mellitus without complications: Secondary | ICD-10-CM

## 2023-10-20 DIAGNOSIS — M79675 Pain in left toe(s): Secondary | ICD-10-CM | POA: Diagnosis not present

## 2023-10-20 DIAGNOSIS — M79674 Pain in right toe(s): Secondary | ICD-10-CM | POA: Diagnosis not present

## 2023-10-20 DIAGNOSIS — B351 Tinea unguium: Secondary | ICD-10-CM

## 2023-10-20 NOTE — Progress Notes (Signed)
 This patient returns to my office for at risk foot care.  This patient requires this care by a professional since this patient will be at risk due to having diabetes.  This patient is unable to cut nails himself since the patient cannot reach his nails.These nails are painful walking and wearing shoes.  He presents to the office with his mother. This patient presents for at risk foot care today.  General Appearance  Alert, conversant and in no acute stress.  Vascular  Dorsalis pedis and posterior tibial  pulses are palpable  bilaterally.  Capillary return is within normal limits  bilaterally. Temperature is within normal limits  bilaterally.  Neurologic  Senn-Weinstein monofilament wire test within normal limits  bilaterally. Muscle power within normal limits bilaterally.  Nails Thick disfigured discolored nails with subungual debris  from hallux to fifth toes bilaterally. No evidence of bacterial infection or drainage bilaterally. Nail dystrophy both great toes.  Orthopedic  No limitations of motion  feet .  No crepitus or effusions noted.  No bony pathology or digital deformities noted.    Skin  normotropic skin with no porokeratosis noted bilaterally.  No signs of infections or ulcers noted.     Onychomycosis  Pain in right toes  Pain in left toes  Consent was obtained for treatment procedures.   Mechanical debridement of nails 1-5  bilaterally performed with a nail nipper.  Filed with dremel without incident.    Return office visit 6  months                    Told patient to return for periodic foot care and evaluation due to potential at risk complications.   Cordella Bold DPM

## 2024-04-18 ENCOUNTER — Ambulatory Visit: Admitting: Podiatry
